# Patient Record
Sex: Female | Born: 1945 | Race: White | Hispanic: No | Marital: Married | Smoking: Former smoker
Health system: Southern US, Community
[De-identification: ages and names within clinical notes are randomized; demographics above are authoritative.]

## PROBLEM LIST (undated history)

## (undated) DIAGNOSIS — J449 Chronic obstructive pulmonary disease, unspecified: Secondary | ICD-10-CM

## (undated) DIAGNOSIS — I341 Nonrheumatic mitral (valve) prolapse: Secondary | ICD-10-CM

## (undated) DIAGNOSIS — R233 Spontaneous ecchymoses: Secondary | ICD-10-CM

## (undated) DIAGNOSIS — R238 Other skin changes: Secondary | ICD-10-CM

## (undated) HISTORY — DX: Spontaneous ecchymoses: R23.3

## (undated) HISTORY — DX: Nonrheumatic mitral (valve) prolapse: I34.1

## (undated) HISTORY — DX: Chronic obstructive pulmonary disease, unspecified: J44.9

## (undated) HISTORY — PX: CHOLECYSTECTOMY: SHX55

## (undated) HISTORY — DX: Other skin changes: R23.8

## (undated) HISTORY — PX: SHOULDER SURGERY: SHX246

---

## 1997-12-20 ENCOUNTER — Inpatient Hospital Stay (HOSPITAL_COMMUNITY): Admission: EM | Admit: 1997-12-20 | Discharge: 1997-12-23 | Payer: Self-pay | Admitting: Cardiology

## 2008-01-04 LAB — PULMONARY FUNCTION TEST

## 2013-04-04 ENCOUNTER — Institutional Professional Consult (permissible substitution): Payer: Self-pay | Admitting: Pulmonary Disease

## 2013-06-16 ENCOUNTER — Ambulatory Visit (INDEPENDENT_AMBULATORY_CARE_PROVIDER_SITE_OTHER): Payer: Medicare PPO

## 2013-06-16 VITALS — BP 125/68 | HR 86 | Resp 18

## 2013-06-16 DIAGNOSIS — M722 Plantar fascial fibromatosis: Secondary | ICD-10-CM

## 2013-06-16 DIAGNOSIS — M79609 Pain in unspecified limb: Secondary | ICD-10-CM

## 2013-06-16 DIAGNOSIS — M7730 Calcaneal spur, unspecified foot: Secondary | ICD-10-CM

## 2013-06-16 MED ORDER — MELOXICAM 15 MG PO TABS: 15.0000 mg | ORAL_TABLET | Freq: Every day | ORAL | Status: DC

## 2013-06-16 MED ORDER — TRIAMCINOLONE ACETONIDE 10 MG/ML IJ SUSP: 10.0000 mg | Freq: Once | INTRAMUSCULAR | Status: AC

## 2013-06-16 NOTE — Progress Notes (Signed)
   Subjective:    Patient ID: Lynn Harris, female    DOB: 1945-05-01, 68 y.o.   MRN: [id number]  HPI my right heel has been hurting and has been going on for about 4 months and hurts on the bottom and has moved to the arch area and is sore and tender and hurts and hurts if I sit too long and used an ice pack and the left is hurting    Review of Systems  Respiratory: Positive for cough and wheezing.   Gastrointestinal:       BLOATING   Endocrine: Positive for heat intolerance.  Musculoskeletal: Positive for gait problem.  Hematological: Bruises/bleeds easily.  All other systems reviewed and are negative.      Objective:   Physical Exam Neurovascular status appears to be intact as follows DP +2/4 bilateral PT plus one over 4 bilateral capillary refill time 3 seconds all digits epicritic and proprioceptive sensations intact and symmetric bilateral there is normal plantar response and DTRs neurologically skin color pigment and hair growth are normal diminished hair growth distally nails criptotic otherwise unremarkable open wounds ulcerations noted no history of injury trauma noted patient indicates that she is wearing some flip-flops and going barefoot for while meds were this exacerbated over the last several months and she's definitely better she does have orthotics that she fit and contour well to the foot which is only worn them intermittently in the last couple of months there's been no significant improvement currently wearing orthotic and athletic type shoe however again has not been wearing orthotics consistently x-rays reveal well-developed inferior calcaneal spur on the right heel previous history for plantar fasciitis on the left which is stable at this time for is having some slight tenderness and mid arch to the right foot has significant pain significant tenderness on the medial calcaneal tubercle from mid arch and medial plantar fascial band on direct palpation and on stretch of  the fascia.       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 at this time patient has significant pain discomfort in her legs take a step first of morning at this time injection tender with Kenalog 20 with Xylocaine plain infiltrated to the inferior plantar heel right heel fascial strapping is applied prescription for [facility] is 4 to the pharmacy as well recommended ice pack to the heel every day maintain her current orthotics or shoes with a good firm stiff sole can use sandals or flip[place]flops at the beach that have some arch support as well recheck in one month if fails to improve or resolve for possible additional injection visual strapping adjustments of orthotics if needed next  Alvan Dame DPM

## 2013-06-16 NOTE — Patient Instructions (Signed)

## 2014-06-09 DIAGNOSIS — H26493 Other secondary cataract, bilateral: Secondary | ICD-10-CM | POA: Diagnosis not present

## 2014-08-10 DIAGNOSIS — N3281 Overactive bladder: Secondary | ICD-10-CM | POA: Diagnosis not present

## 2014-08-10 DIAGNOSIS — F419 Anxiety disorder, unspecified: Secondary | ICD-10-CM | POA: Diagnosis not present

## 2014-08-10 DIAGNOSIS — G2581 Restless legs syndrome: Secondary | ICD-10-CM | POA: Diagnosis not present

## 2014-08-10 DIAGNOSIS — M810 Age-related osteoporosis without current pathological fracture: Secondary | ICD-10-CM | POA: Diagnosis not present

## 2014-08-10 DIAGNOSIS — M79641 Pain in right hand: Secondary | ICD-10-CM | POA: Diagnosis not present

## 2014-08-10 DIAGNOSIS — E780 Pure hypercholesterolemia: Secondary | ICD-10-CM | POA: Diagnosis not present

## 2014-08-10 DIAGNOSIS — F329 Major depressive disorder, single episode, unspecified: Secondary | ICD-10-CM | POA: Diagnosis not present

## 2014-08-10 DIAGNOSIS — I10 Essential (primary) hypertension: Secondary | ICD-10-CM | POA: Diagnosis not present

## 2014-08-10 DIAGNOSIS — J449 Chronic obstructive pulmonary disease, unspecified: Secondary | ICD-10-CM | POA: Diagnosis not present

## 2014-08-24 DIAGNOSIS — G2581 Restless legs syndrome: Secondary | ICD-10-CM | POA: Diagnosis not present

## 2014-08-24 DIAGNOSIS — F419 Anxiety disorder, unspecified: Secondary | ICD-10-CM | POA: Diagnosis not present

## 2014-08-24 DIAGNOSIS — E780 Pure hypercholesterolemia: Secondary | ICD-10-CM | POA: Diagnosis not present

## 2014-08-24 DIAGNOSIS — M79641 Pain in right hand: Secondary | ICD-10-CM | POA: Diagnosis not present

## 2014-08-24 DIAGNOSIS — M25569 Pain in unspecified knee: Secondary | ICD-10-CM | POA: Diagnosis not present

## 2014-08-24 DIAGNOSIS — F329 Major depressive disorder, single episode, unspecified: Secondary | ICD-10-CM | POA: Diagnosis not present

## 2014-08-24 DIAGNOSIS — M810 Age-related osteoporosis without current pathological fracture: Secondary | ICD-10-CM | POA: Diagnosis not present

## 2014-08-24 DIAGNOSIS — I10 Essential (primary) hypertension: Secondary | ICD-10-CM | POA: Diagnosis not present

## 2014-08-24 DIAGNOSIS — J449 Chronic obstructive pulmonary disease, unspecified: Secondary | ICD-10-CM | POA: Diagnosis not present

## 2014-09-11 DIAGNOSIS — R6884 Jaw pain: Secondary | ICD-10-CM | POA: Diagnosis not present

## 2014-09-16 DIAGNOSIS — M810 Age-related osteoporosis without current pathological fracture: Secondary | ICD-10-CM | POA: Diagnosis not present

## 2014-09-16 DIAGNOSIS — J209 Acute bronchitis, unspecified: Secondary | ICD-10-CM | POA: Diagnosis not present

## 2014-09-16 DIAGNOSIS — M25569 Pain in unspecified knee: Secondary | ICD-10-CM | POA: Diagnosis not present

## 2014-09-16 DIAGNOSIS — G2581 Restless legs syndrome: Secondary | ICD-10-CM | POA: Diagnosis not present

## 2014-09-16 DIAGNOSIS — F329 Major depressive disorder, single episode, unspecified: Secondary | ICD-10-CM | POA: Diagnosis not present

## 2014-09-16 DIAGNOSIS — I10 Essential (primary) hypertension: Secondary | ICD-10-CM | POA: Diagnosis not present

## 2014-09-16 DIAGNOSIS — E780 Pure hypercholesterolemia: Secondary | ICD-10-CM | POA: Diagnosis not present

## 2014-09-16 DIAGNOSIS — F419 Anxiety disorder, unspecified: Secondary | ICD-10-CM | POA: Diagnosis not present

## 2014-09-16 DIAGNOSIS — M79641 Pain in right hand: Secondary | ICD-10-CM | POA: Diagnosis not present

## 2014-09-30 DIAGNOSIS — E780 Pure hypercholesterolemia: Secondary | ICD-10-CM | POA: Diagnosis not present

## 2014-09-30 DIAGNOSIS — F329 Major depressive disorder, single episode, unspecified: Secondary | ICD-10-CM | POA: Diagnosis not present

## 2014-09-30 DIAGNOSIS — J209 Acute bronchitis, unspecified: Secondary | ICD-10-CM | POA: Diagnosis not present

## 2014-09-30 DIAGNOSIS — I10 Essential (primary) hypertension: Secondary | ICD-10-CM | POA: Diagnosis not present

## 2014-09-30 DIAGNOSIS — Z9181 History of falling: Secondary | ICD-10-CM | POA: Diagnosis not present

## 2014-09-30 DIAGNOSIS — M810 Age-related osteoporosis without current pathological fracture: Secondary | ICD-10-CM | POA: Diagnosis not present

## 2014-09-30 DIAGNOSIS — N3281 Overactive bladder: Secondary | ICD-10-CM | POA: Diagnosis not present

## 2014-09-30 DIAGNOSIS — G2581 Restless legs syndrome: Secondary | ICD-10-CM | POA: Diagnosis not present

## 2014-09-30 DIAGNOSIS — F419 Anxiety disorder, unspecified: Secondary | ICD-10-CM | POA: Diagnosis not present

## 2014-10-04 DIAGNOSIS — M8589 Other specified disorders of bone density and structure, multiple sites: Secondary | ICD-10-CM | POA: Diagnosis not present

## 2014-10-24 DIAGNOSIS — M810 Age-related osteoporosis without current pathological fracture: Secondary | ICD-10-CM | POA: Diagnosis not present

## 2014-10-24 DIAGNOSIS — F419 Anxiety disorder, unspecified: Secondary | ICD-10-CM | POA: Diagnosis not present

## 2014-10-24 DIAGNOSIS — N3281 Overactive bladder: Secondary | ICD-10-CM | POA: Diagnosis not present

## 2014-10-24 DIAGNOSIS — J209 Acute bronchitis, unspecified: Secondary | ICD-10-CM | POA: Diagnosis not present

## 2014-10-24 DIAGNOSIS — G2581 Restless legs syndrome: Secondary | ICD-10-CM | POA: Diagnosis not present

## 2014-10-24 DIAGNOSIS — E7800 Pure hypercholesterolemia, unspecified: Secondary | ICD-10-CM | POA: Diagnosis not present

## 2014-10-24 DIAGNOSIS — M79641 Pain in right hand: Secondary | ICD-10-CM | POA: Diagnosis not present

## 2014-10-24 DIAGNOSIS — I10 Essential (primary) hypertension: Secondary | ICD-10-CM | POA: Diagnosis not present

## 2014-10-24 DIAGNOSIS — M25569 Pain in unspecified knee: Secondary | ICD-10-CM | POA: Diagnosis not present

## 2014-12-02 DIAGNOSIS — M810 Age-related osteoporosis without current pathological fracture: Secondary | ICD-10-CM | POA: Diagnosis not present

## 2014-12-02 DIAGNOSIS — F419 Anxiety disorder, unspecified: Secondary | ICD-10-CM | POA: Diagnosis not present

## 2014-12-02 DIAGNOSIS — M79641 Pain in right hand: Secondary | ICD-10-CM | POA: Diagnosis not present

## 2014-12-02 DIAGNOSIS — G2581 Restless legs syndrome: Secondary | ICD-10-CM | POA: Diagnosis not present

## 2014-12-02 DIAGNOSIS — F329 Major depressive disorder, single episode, unspecified: Secondary | ICD-10-CM | POA: Diagnosis not present

## 2014-12-02 DIAGNOSIS — M25569 Pain in unspecified knee: Secondary | ICD-10-CM | POA: Diagnosis not present

## 2014-12-02 DIAGNOSIS — E7800 Pure hypercholesterolemia, unspecified: Secondary | ICD-10-CM | POA: Diagnosis not present

## 2014-12-02 DIAGNOSIS — J209 Acute bronchitis, unspecified: Secondary | ICD-10-CM | POA: Diagnosis not present

## 2014-12-02 DIAGNOSIS — I10 Essential (primary) hypertension: Secondary | ICD-10-CM | POA: Diagnosis not present

## 2014-12-28 DIAGNOSIS — E7800 Pure hypercholesterolemia, unspecified: Secondary | ICD-10-CM | POA: Diagnosis not present

## 2014-12-28 DIAGNOSIS — G2581 Restless legs syndrome: Secondary | ICD-10-CM | POA: Diagnosis not present

## 2014-12-28 DIAGNOSIS — J449 Chronic obstructive pulmonary disease, unspecified: Secondary | ICD-10-CM | POA: Diagnosis not present

## 2014-12-28 DIAGNOSIS — M810 Age-related osteoporosis without current pathological fracture: Secondary | ICD-10-CM | POA: Diagnosis not present

## 2014-12-28 DIAGNOSIS — M25569 Pain in unspecified knee: Secondary | ICD-10-CM | POA: Diagnosis not present

## 2014-12-28 DIAGNOSIS — F419 Anxiety disorder, unspecified: Secondary | ICD-10-CM | POA: Diagnosis not present

## 2014-12-28 DIAGNOSIS — N3281 Overactive bladder: Secondary | ICD-10-CM | POA: Diagnosis not present

## 2014-12-28 DIAGNOSIS — F329 Major depressive disorder, single episode, unspecified: Secondary | ICD-10-CM | POA: Diagnosis not present

## 2014-12-28 DIAGNOSIS — J209 Acute bronchitis, unspecified: Secondary | ICD-10-CM | POA: Diagnosis not present

## 2015-01-23 ENCOUNTER — Ambulatory Visit (INDEPENDENT_AMBULATORY_CARE_PROVIDER_SITE_OTHER)
Admission: RE | Admit: 2015-01-23 | Discharge: 2015-01-23 | Disposition: A | Discharge status: Home / Self Care | Payer: Medicare Other | Source: Ambulatory Visit | Attending: Internal Medicine | Admitting: Internal Medicine

## 2015-01-23 ENCOUNTER — Ambulatory Visit (INDEPENDENT_AMBULATORY_CARE_PROVIDER_SITE_OTHER): Payer: Medicare Other | Admitting: Internal Medicine

## 2015-01-23 ENCOUNTER — Encounter: Payer: Self-pay | Admitting: Internal Medicine

## 2015-01-23 VITALS — BP 132/70 | HR 114 | Ht 62.5 in | Wt 137.4 lb

## 2015-01-23 DIAGNOSIS — J449 Chronic obstructive pulmonary disease, unspecified: Secondary | ICD-10-CM

## 2015-01-23 MED ORDER — TIOTROPIUM BROMIDE-OLODATEROL 2.5-2.5 MCG/ACT IN AERS: 2.0000 | INHALATION_SPRAY | Freq: Every day | RESPIRATORY_TRACT | Status: DC

## 2015-01-23 MED ORDER — PANTOPRAZOLE SODIUM 40 MG PO TBEC: 40.0000 mg | DELAYED_RELEASE_TABLET | Freq: Every day | ORAL | Status: DC

## 2015-01-23 MED ORDER — PREDNISONE 10 MG PO TABS: ORAL_TABLET | ORAL | Status: DC

## 2015-01-23 NOTE — Progress Notes (Addendum)
Subjective:    Patient ID: Lynn Harris, female    DOB: Jul 01, 1945,   MRN: [id number]  HPI  [age] yowf quit smoking 2003 with cough / breathing issues with improved breathing the cough never did resolve completely eval by Chodri and some better spiriva and symbicort then much worse x early fall 2016 felt like coming down with bad cough assoc aches and fever and cough persisted so self referred to pulmonary clinic 01/23/2015    01/23/2015 1st [facility] office visit/ Tyion Boylen   Chief Complaint  Patient presents with  . Pulmonary Consult    Self referral for COPD. Pt states that she was dxed with COPD in 2007. She c/o increased cough and SOB for the past 4 months. Cough is prod with clear/white to yellow/green sputum.  She states that she gets SOB with doing housework and "when I get too hot".    was on spiriva dpi prior to the worsening > symbicort added back and couldn't tol > flovent only / not helping though pred always does (very poor hfa see a/p) Cough is 24/7 some worse in ams - onset was same time the breathing worsened  Doe = MMRC2 = can't walk a nl pace on a flat grade s sob    No obvious   day to day or daytime variabilty or assoc chronic cough or cp or chest tightness, subjective wheeze overt sinus or hb symptoms. No unusual exp hx or h/o childhood pna/ asthma or knowledge of premature birth.  Sleeping ok without nocturnal  or early am exacerbation  of respiratory  c/o's or need for noct saba. Also denies any obvious fluctuation of symptoms with weather or environmental changes or other aggravating or alleviating factors except as outlined above   Current Medications, Allergies, Complete Past Medical History, Past Surgical History, Family History, and Social History were reviewed in [facility] electronic medical record.               Review of Systems  Constitutional: Negative for fever, chills and unexpected weight change.  HENT: Positive for congestion and  rhinorrhea. Negative for dental problem, ear pain, nosebleeds, postnasal drip, sinus pressure, sneezing, sore throat, trouble swallowing and voice change.   Eyes: Negative for visual disturbance.  Respiratory: Positive for cough and shortness of breath. Negative for choking.   Cardiovascular: Negative for chest pain and leg swelling.  Gastrointestinal: Negative for vomiting, abdominal pain and diarrhea.  Genitourinary: Negative for difficulty urinating.  Musculoskeletal: Negative for arthralgias.  Skin: Negative for rash.  Neurological: Negative for tremors, syncope and headaches.  Hematological: Does not bruise/bleed easily.       Objective:   Physical Exam  amb anxious wf very evasive with questions re symptoms  Wt Readings from Last 3 Encounters:  01/23/15 137 lb 6.4 oz (62.324 kg)    Vital signs reviewed   HEENT: nl dentition, turbinates, and oropharynx. Nl external ear canals without cough reflex   NECK :  without JVD/Nodes/TM/ nl carotid upstrokes bilaterally   LUNGS: no acc muscle use,  slt barrel contour chest which is clear to A and P bilaterally though bs are distant and Texp mod prolonged    CV:  RRR  no s3 or murmur or increase in P2, no edema   ABD:  soft and nontender with nl inspiratory excursion in the supine position. No bruits or organomegaly, bowel sounds nl  MS:  Nl gait/ ext warm without deformities, calf tenderness, cyanosis or clubbing No obvious  joint restrictions   SKIN: warm and dry without lesions    NEURO:  alert, approp, nl sensorium with  no motor deficits    CXR PA and Lateral:   01/23/2015 :    I personally reviewed images and agree with radiology impression as follows:   COPD. There is no pneumonia, CHF, nor other acute cardiopulmonary abnormality            Assessment & Plan:

## 2015-01-23 NOTE — Patient Instructions (Addendum)
Stop spiriva and flovent   Stiolto 2 puffs each am , if can't take the whole dose just take one puff  Prednisone 10 mg take two with breakfast, then one daily with breakfast until return   Pantoprazole (protonix) 40 mg   Take  30-60 min before first meal of the day and Pepcid (famotidine)  20 mg one @  bedtime until return to office - this is the best way to tell whether stomach acid is contributing to your problem.   Please remember to go to the x-ray department downstairs for your tests - we will call you with the results when they are available.  Please schedule a follow up office visit in 4 weeks, sooner if needed When return bring your medications in 2 separate bags, the ones you take no matter(automatically)  what vs the as needed (only when you feel you need them)  Late add[facility]

## 2015-01-24 NOTE — Progress Notes (Signed)
Quick Note:  Spoke with pt and notified of results per Dr. Wert. Pt verbalized understanding and denied any questions.  ______ 

## 2015-01-24 NOTE — Assessment & Plan Note (Addendum)
DDX of  difficult airways management almost all start with A and  include Adherence, Ace Inhibitors, Acid Reflux, Active Sinus Disease, Alpha 1 Antitripsin deficiency, Anxiety masquerading as Airways dz,  ABPA,  Allergy(esp in young), Aspiration (esp in elderly), Adverse effects of meds,  Active smokers, A bunch of PE's (a small clot burden can't cause this syndrome unless there is already severe underlying pulm or vascular dz with poor reserve) plus two Bs  = Bronchiectasis and Beta blocker use..and one C= CHF   Adherence is always the initial "prime suspect" and is a multilayered concern that requires a "trust but verify" approach in every patient - starting with knowing how to use medications, especially inhalers, correctly, keeping up with refills and understanding the fundamental difference between maintenance and prns vs those medications only taken for a very short course and then stopped and not refilled.  - The proper method of use, as well as anticipated side effects, of a metered-dose inhaler are discussed and demonstrated to the patient. Improved effectiveness after extensive coaching during this visit to a level of approximately 75 % from a baseline of 25 %   ? Acid (or non-acid) GERD > always difficult to exclude as up to 75% of pts in some series report no assoc GI/ Heartburn symptoms> rec max (24h)  acid suppression and diet restrictions/ reviewed and instructions given in writing.  ? Adverse effects of meds, esp dpi suggested by refractory cough > try off    ? Anxiety > usually at the bottom of this list of usual suspects but should be much higher on this pt's based on H and P and note already on psychotropics .   ? Allergy/ asthma > not clear but says pred "only thing that helps " so try prednisone 20 mg daily until better then 10 mg daily   I had an extended discussion with the patient reviewing all relevant studies completed to date and  lasting 35 minutes of a 60 minute initial  eval ov  Each maintenance medication was reviewed in detail including most importantly the difference between maintenance and prns and under what circumstances the prns are to be triggered using an action plan format that is not reflected in the computer generated alphabetically organized AVS.    Please see instructions for details which were reviewed in writing and the patient given a copy highlighting the part that I personally wrote and discussed at today's ov.

## 2015-02-26 ENCOUNTER — Ambulatory Visit (INDEPENDENT_AMBULATORY_CARE_PROVIDER_SITE_OTHER): Payer: Medicare Other | Admitting: Internal Medicine

## 2015-02-26 ENCOUNTER — Encounter: Payer: Self-pay | Admitting: Internal Medicine

## 2015-02-26 VITALS — BP 134/82 | HR 99 | Wt 134.8 lb

## 2015-02-26 DIAGNOSIS — R05 Cough: Secondary | ICD-10-CM

## 2015-02-26 DIAGNOSIS — R058 Other specified cough: Secondary | ICD-10-CM | POA: Insufficient documentation

## 2015-02-26 DIAGNOSIS — J449 Chronic obstructive pulmonary disease, unspecified: Secondary | ICD-10-CM | POA: Diagnosis not present

## 2015-02-26 MED ORDER — ALBUTEROL SULFATE HFA 108 (90 BASE) MCG/ACT IN AERS: INHALATION_SPRAY | RESPIRATORY_TRACT | Status: DC

## 2015-02-26 NOTE — Progress Notes (Signed)
Subjective:    Patient ID: Lynn Harris, female    DOB: 10-21-1945,   MRN: [id number]    Brief patient profile:  64 yowf quit smoking 2003 with cough / breathing issues with improved breathing the cough never did resolve completely eval by Chodri and some better spiriva and symbicort then much worse x early fall 2016 felt like coming down with bad cough assoc aches and fever and cough persisted so self referred to pulmonary clinic 01/23/2015      History of Present Illness  01/23/2015 1st [facility] office visit/ Lynn Harris   Chief Complaint  Patient presents with  . Pulmonary Consult    Self referral for COPD. Pt states that she was dxed with COPD in 2007. She c/o increased cough and SOB for the past 4 months. Cough is prod with clear/white to yellow/green sputum.  She states that she gets SOB with doing housework and "when I get too hot".    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  Please remember to go to the x-ray department downstairs for your tests - we will call you with the results when they are available. Please schedule a follow up office visit in 4 weeks, sooner if needed When return bring your medications in 2 separate bags, the ones you take no matter(automatically)  what vs the as needed (only when you feel you  need them)      02/26/2015  f/u ov/Lynn Harris re:    Copd GOLD III criteria/chronic cough /  maint stiolto / ppi qam ac / no pred did not bring all meds  Chief Complaint  Patient presents with  . Follow-up    Cough is some better. Her breathing has also improved. No new co's today.   still coughing esp early in am but breathing has improved on stiolto/ newly disclosed has been taking fosfamax chronically / not taking pepcid ac hs as rec. Cough remains mostly nonproductive   No obvious day to day or daytime variability or assoc limiting sob/ excess mucus  or cp or chest tightness, subjective wheeze or overt sinus or hb symptoms. No unusual exp hx or h/o childhood pna/ asthma or knowledge of premature birth.  Sleeping ok without nocturnal  or early am exacerbation  of respiratory  c/o's or need for noct saba. Also denies any obvious fluctuation of symptoms with weather or environmental changes or other aggravating or alleviating factors except as outlined above   Current Medications, Allergies, Complete Past Medical History, Past Surgical History, Family History, and Social History were reviewed in [facility] record.  ROS  The following are not active complaints unless bolded sore throat, dysphagia, dental problems, itching, sneezing,  nasal congestion or excess/ purulent secretions, ear ache,   fever, chills, sweats, unintended wt loss, classically pleuritic or exertional cp, hemoptysis,  orthopnea pnd or leg swelling, presyncope, palpitations, abdominal pain, anorexia, nausea, vomiting, diarrhea  or change in bowel or bladder habits, change in stools or urine, dysuria,hematuria,  rash, arthralgias, visual complaints, headache, numbness, weakness or ataxia or problems with walking or coordination,  change in mood/affect or memory.           Objective:   Physical Exam  amb anxious wf very evasive with questions re symptoms "I've got bronchitis again"   Wt Readings from  Last 3 Encounters:  02/26/15 134 lb 12.8 oz (61.145 kg)  01/23/15 137 lb 6.4 oz (62.324 kg)    Vital signs reviewed   HEENT: nl dentition, turbinates, and oropharynx. Nl external ear canals without cough reflex   NECK :  without JVD/Nodes/TM/ nl carotid upstrokes bilaterally   LUNGS: no acc muscle use,  slt barrel contour chest which is clear to A and P bilaterally though bs are distant and Texp mod prolonged    CV:  RRR  no s3 or murmur or increase in P2, no edema   ABD:  soft and nontender with pos hoover's mid insp in the supine position. No bruits or organomegaly, bowel sounds nl  MS:  Nl gait/ ext warm without deformities, calf tenderness, cyanosis or clubbing No obvious joint restrictions   SKIN: warm and dry without lesions    NEURO:  alert, approp, nl sensorium with  no motor deficits    CXR PA and Lateral:   01/23/2015 :    I personally reviewed images and agree with radiology impression as follows:   COPD. There is no pneumonia, CHF, nor other acute cardiopulmonary abnormality            Assessment & Plan:

## 2015-02-26 NOTE — Patient Instructions (Signed)
Try off the fosfamax until cough is 100% gone to your satisfaction   Start Pepcid ac 20 mg at bedtime to see to what extent it helps your am cough over the next 6 weeks   If you are satisfied with your treatment plan,  let your doctor know and he/she can either refill your medications or you can return here when your prescription runs out.     If in any way you are not 100% satisfied,  please tell us.  If 100% better, tell your friends!  Pulmonary follow up is as needed

## 2015-02-26 NOTE — Assessment & Plan Note (Addendum)
01/23/2015    try stiolto 2 each am - Spirometry 02/26/2015  FEV1 0.86 (41%)  Ratio 51   - The proper method of use, as well as anticipated side effects, of a metered-dose inhaler are discussed and demonstrated to the patient. Improved effectiveness after extensive coaching during this visit to a level of approximately 75 % from a baseline of 50 %   No pfts received with Dr Terrilee Croak notes as requested (will re-request) but in meantim she is a likely GOLD III copd severity and doing better on stiolto to date but alse needs rescue rx  - The proper method of use, as well as anticipated side effects, of a metered-dose inhaler are discussed and demonstrated to the patient. Improved effectiveness after extensive coaching during this visit to a level of approximately 75 % from a baseline of 50 %   I had an extended discussion with the patient reviewing all relevant studies completed to date and  lasting 15 to 20 minutes of a 25 minute visit  1) no regular need for pulmonary f/u if not limited from desired activities or having flares on increase need for saba   2) Each maintenance medication was reviewed in detail including most importantly the difference between maintenance and prns and under what circumstances the prns are to be triggered using an action plan format that is not reflected in the computer generated alphabetically organized AVS.    Please see instructions for details which were reviewed in writing and the patient given a copy highlighting the part that I personally wrote and discussed at today's ov.

## 2015-02-26 NOTE — Assessment & Plan Note (Signed)
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   These are the same pts (now being labeled as having "irritable larynx syndrome" by some cough centers) who not infrequently have a history of having failed to tolerate ace inhibitors,  dry powder inhalers(like spiriva)  or biphosphonates(like fosfamax)  or report having atypical reflux symptoms that don't respond to standard doses of PPI , and are easily confused as having aecopd or asthma flares by even experienced allergists/ pulmonologists.   rec max gerd off, continue the respimat version of laba/lama and off fosfamax until completely free of cough then ok to rechallenge.

## 2015-04-25 ENCOUNTER — Telehealth: Payer: Self-pay | Admitting: Internal Medicine

## 2015-04-25 ENCOUNTER — Other Ambulatory Visit: Payer: Self-pay | Admitting: Internal Medicine

## 2015-04-25 DIAGNOSIS — J449 Chronic obstructive pulmonary disease, unspecified: Secondary | ICD-10-CM

## 2015-04-25 MED ORDER — PANTOPRAZOLE SODIUM 40 MG PO TBEC: 40.0000 mg | DELAYED_RELEASE_TABLET | Freq: Every day | ORAL | Status: DC

## 2015-04-25 NOTE — Telephone Encounter (Signed)
Spoke with pt. She needs a refill on pantoprazole. This has been sent in. Nothing further was needed.

## 2016-02-06 ENCOUNTER — Other Ambulatory Visit: Payer: Self-pay | Admitting: Internal Medicine

## 2016-02-06 DIAGNOSIS — J449 Chronic obstructive pulmonary disease, unspecified: Secondary | ICD-10-CM

## 2017-02-25 ENCOUNTER — Other Ambulatory Visit: Payer: Self-pay | Admitting: Internal Medicine

## 2017-02-25 DIAGNOSIS — J449 Chronic obstructive pulmonary disease, unspecified: Secondary | ICD-10-CM

## 2017-03-25 ENCOUNTER — Other Ambulatory Visit: Payer: Self-pay | Admitting: Internal Medicine

## 2017-03-25 DIAGNOSIS — J449 Chronic obstructive pulmonary disease, unspecified: Secondary | ICD-10-CM

## 2019-03-10 DIAGNOSIS — J449 Chronic obstructive pulmonary disease, unspecified: Secondary | ICD-10-CM | POA: Diagnosis not present

## 2019-03-10 DIAGNOSIS — E7800 Pure hypercholesterolemia, unspecified: Secondary | ICD-10-CM | POA: Diagnosis not present

## 2019-03-10 DIAGNOSIS — M25569 Pain in unspecified knee: Secondary | ICD-10-CM | POA: Diagnosis not present

## 2019-03-10 DIAGNOSIS — F419 Anxiety disorder, unspecified: Secondary | ICD-10-CM | POA: Diagnosis not present

## 2019-03-10 DIAGNOSIS — G4733 Obstructive sleep apnea (adult) (pediatric): Secondary | ICD-10-CM | POA: Diagnosis not present

## 2019-03-10 DIAGNOSIS — F3341 Major depressive disorder, recurrent, in partial remission: Secondary | ICD-10-CM | POA: Diagnosis not present

## 2019-03-10 DIAGNOSIS — M79641 Pain in right hand: Secondary | ICD-10-CM | POA: Diagnosis not present

## 2019-03-10 DIAGNOSIS — N3281 Overactive bladder: Secondary | ICD-10-CM | POA: Diagnosis not present

## 2019-03-10 DIAGNOSIS — G2581 Restless legs syndrome: Secondary | ICD-10-CM | POA: Diagnosis not present

## 2019-03-10 DIAGNOSIS — M810 Age-related osteoporosis without current pathological fracture: Secondary | ICD-10-CM | POA: Diagnosis not present

## 2019-03-10 DIAGNOSIS — R030 Elevated blood-pressure reading, without diagnosis of hypertension: Secondary | ICD-10-CM | POA: Diagnosis not present

## 2019-04-06 DIAGNOSIS — L820 Inflamed seborrheic keratosis: Secondary | ICD-10-CM | POA: Diagnosis not present

## 2019-04-06 DIAGNOSIS — D225 Melanocytic nevi of trunk: Secondary | ICD-10-CM | POA: Diagnosis not present

## 2019-04-06 DIAGNOSIS — C44519 Basal cell carcinoma of skin of other part of trunk: Secondary | ICD-10-CM | POA: Diagnosis not present

## 2019-04-06 DIAGNOSIS — L821 Other seborrheic keratosis: Secondary | ICD-10-CM | POA: Diagnosis not present

## 2019-04-07 DIAGNOSIS — F419 Anxiety disorder, unspecified: Secondary | ICD-10-CM | POA: Diagnosis not present

## 2019-04-07 DIAGNOSIS — R030 Elevated blood-pressure reading, without diagnosis of hypertension: Secondary | ICD-10-CM | POA: Diagnosis not present

## 2019-04-07 DIAGNOSIS — Z87891 Personal history of nicotine dependence: Secondary | ICD-10-CM | POA: Diagnosis not present

## 2019-04-07 DIAGNOSIS — R32 Unspecified urinary incontinence: Secondary | ICD-10-CM | POA: Diagnosis not present

## 2019-04-07 DIAGNOSIS — Z809 Family history of malignant neoplasm, unspecified: Secondary | ICD-10-CM | POA: Diagnosis not present

## 2019-04-07 DIAGNOSIS — G2581 Restless legs syndrome: Secondary | ICD-10-CM | POA: Diagnosis not present

## 2019-04-07 DIAGNOSIS — J449 Chronic obstructive pulmonary disease, unspecified: Secondary | ICD-10-CM | POA: Diagnosis not present

## 2019-06-07 DIAGNOSIS — M25569 Pain in unspecified knee: Secondary | ICD-10-CM | POA: Diagnosis not present

## 2019-06-07 DIAGNOSIS — R030 Elevated blood-pressure reading, without diagnosis of hypertension: Secondary | ICD-10-CM | POA: Diagnosis not present

## 2019-06-07 DIAGNOSIS — G2581 Restless legs syndrome: Secondary | ICD-10-CM | POA: Diagnosis not present

## 2019-06-07 DIAGNOSIS — F3341 Major depressive disorder, recurrent, in partial remission: Secondary | ICD-10-CM | POA: Diagnosis not present

## 2019-06-07 DIAGNOSIS — F419 Anxiety disorder, unspecified: Secondary | ICD-10-CM | POA: Diagnosis not present

## 2019-06-07 DIAGNOSIS — M810 Age-related osteoporosis without current pathological fracture: Secondary | ICD-10-CM | POA: Diagnosis not present

## 2019-06-07 DIAGNOSIS — N3281 Overactive bladder: Secondary | ICD-10-CM | POA: Diagnosis not present

## 2019-06-07 DIAGNOSIS — M79641 Pain in right hand: Secondary | ICD-10-CM | POA: Diagnosis not present

## 2019-06-07 DIAGNOSIS — E7800 Pure hypercholesterolemia, unspecified: Secondary | ICD-10-CM | POA: Diagnosis not present

## 2019-07-11 DIAGNOSIS — H0100B Unspecified blepharitis left eye, upper and lower eyelids: Secondary | ICD-10-CM | POA: Diagnosis not present

## 2019-07-11 DIAGNOSIS — H0100A Unspecified blepharitis right eye, upper and lower eyelids: Secondary | ICD-10-CM | POA: Diagnosis not present

## 2019-08-12 DIAGNOSIS — Z87891 Personal history of nicotine dependence: Secondary | ICD-10-CM | POA: Diagnosis not present

## 2019-08-12 DIAGNOSIS — M1711 Unilateral primary osteoarthritis, right knee: Secondary | ICD-10-CM | POA: Diagnosis not present

## 2019-08-12 DIAGNOSIS — Z79899 Other long term (current) drug therapy: Secondary | ICD-10-CM | POA: Diagnosis not present

## 2019-08-12 DIAGNOSIS — M79632 Pain in left forearm: Secondary | ICD-10-CM | POA: Diagnosis not present

## 2019-08-12 DIAGNOSIS — S59912A Unspecified injury of left forearm, initial encounter: Secondary | ICD-10-CM | POA: Diagnosis not present

## 2019-08-12 DIAGNOSIS — M25561 Pain in right knee: Secondary | ICD-10-CM | POA: Diagnosis not present

## 2019-09-07 DIAGNOSIS — N3281 Overactive bladder: Secondary | ICD-10-CM | POA: Diagnosis not present

## 2019-09-07 DIAGNOSIS — F419 Anxiety disorder, unspecified: Secondary | ICD-10-CM | POA: Diagnosis not present

## 2019-09-07 DIAGNOSIS — G2581 Restless legs syndrome: Secondary | ICD-10-CM | POA: Diagnosis not present

## 2019-09-07 DIAGNOSIS — Z9181 History of falling: Secondary | ICD-10-CM | POA: Diagnosis not present

## 2019-09-07 DIAGNOSIS — F3341 Major depressive disorder, recurrent, in partial remission: Secondary | ICD-10-CM | POA: Diagnosis not present

## 2019-09-07 DIAGNOSIS — M79641 Pain in right hand: Secondary | ICD-10-CM | POA: Diagnosis not present

## 2019-09-07 DIAGNOSIS — J449 Chronic obstructive pulmonary disease, unspecified: Secondary | ICD-10-CM | POA: Diagnosis not present

## 2019-09-07 DIAGNOSIS — M810 Age-related osteoporosis without current pathological fracture: Secondary | ICD-10-CM | POA: Diagnosis not present

## 2019-09-07 DIAGNOSIS — E7800 Pure hypercholesterolemia, unspecified: Secondary | ICD-10-CM | POA: Diagnosis not present

## 2019-09-07 DIAGNOSIS — R030 Elevated blood-pressure reading, without diagnosis of hypertension: Secondary | ICD-10-CM | POA: Diagnosis not present

## 2019-09-07 DIAGNOSIS — M25569 Pain in unspecified knee: Secondary | ICD-10-CM | POA: Diagnosis not present

## 2019-09-15 DIAGNOSIS — J209 Acute bronchitis, unspecified: Secondary | ICD-10-CM | POA: Diagnosis not present

## 2019-09-15 DIAGNOSIS — Z20822 Contact with and (suspected) exposure to covid-19: Secondary | ICD-10-CM | POA: Diagnosis not present

## 2019-11-04 DIAGNOSIS — Z23 Encounter for immunization: Secondary | ICD-10-CM | POA: Diagnosis not present

## 2019-12-14 DIAGNOSIS — J449 Chronic obstructive pulmonary disease, unspecified: Secondary | ICD-10-CM | POA: Diagnosis not present

## 2019-12-14 DIAGNOSIS — M79641 Pain in right hand: Secondary | ICD-10-CM | POA: Diagnosis not present

## 2019-12-14 DIAGNOSIS — G2581 Restless legs syndrome: Secondary | ICD-10-CM | POA: Diagnosis not present

## 2019-12-14 DIAGNOSIS — M810 Age-related osteoporosis without current pathological fracture: Secondary | ICD-10-CM | POA: Diagnosis not present

## 2019-12-14 DIAGNOSIS — N3281 Overactive bladder: Secondary | ICD-10-CM | POA: Diagnosis not present

## 2019-12-14 DIAGNOSIS — E7800 Pure hypercholesterolemia, unspecified: Secondary | ICD-10-CM | POA: Diagnosis not present

## 2019-12-14 DIAGNOSIS — M25569 Pain in unspecified knee: Secondary | ICD-10-CM | POA: Diagnosis not present

## 2019-12-14 DIAGNOSIS — F3341 Major depressive disorder, recurrent, in partial remission: Secondary | ICD-10-CM | POA: Diagnosis not present

## 2019-12-14 DIAGNOSIS — F419 Anxiety disorder, unspecified: Secondary | ICD-10-CM | POA: Diagnosis not present

## 2019-12-14 DIAGNOSIS — G4733 Obstructive sleep apnea (adult) (pediatric): Secondary | ICD-10-CM | POA: Diagnosis not present

## 2019-12-14 DIAGNOSIS — R030 Elevated blood-pressure reading, without diagnosis of hypertension: Secondary | ICD-10-CM | POA: Diagnosis not present

## 2019-12-28 DIAGNOSIS — F3341 Major depressive disorder, recurrent, in partial remission: Secondary | ICD-10-CM | POA: Diagnosis not present

## 2019-12-28 DIAGNOSIS — M79641 Pain in right hand: Secondary | ICD-10-CM | POA: Diagnosis not present

## 2019-12-28 DIAGNOSIS — F419 Anxiety disorder, unspecified: Secondary | ICD-10-CM | POA: Diagnosis not present

## 2019-12-28 DIAGNOSIS — G2581 Restless legs syndrome: Secondary | ICD-10-CM | POA: Diagnosis not present

## 2019-12-28 DIAGNOSIS — M25569 Pain in unspecified knee: Secondary | ICD-10-CM | POA: Diagnosis not present

## 2019-12-28 DIAGNOSIS — Z139 Encounter for screening, unspecified: Secondary | ICD-10-CM | POA: Diagnosis not present

## 2019-12-28 DIAGNOSIS — N3281 Overactive bladder: Secondary | ICD-10-CM | POA: Diagnosis not present

## 2019-12-28 DIAGNOSIS — J449 Chronic obstructive pulmonary disease, unspecified: Secondary | ICD-10-CM | POA: Diagnosis not present

## 2019-12-28 DIAGNOSIS — G4733 Obstructive sleep apnea (adult) (pediatric): Secondary | ICD-10-CM | POA: Diagnosis not present

## 2020-01-25 DIAGNOSIS — N3281 Overactive bladder: Secondary | ICD-10-CM | POA: Diagnosis not present

## 2020-01-25 DIAGNOSIS — G2581 Restless legs syndrome: Secondary | ICD-10-CM | POA: Diagnosis not present

## 2020-01-25 DIAGNOSIS — G4733 Obstructive sleep apnea (adult) (pediatric): Secondary | ICD-10-CM | POA: Diagnosis not present

## 2020-01-25 DIAGNOSIS — F419 Anxiety disorder, unspecified: Secondary | ICD-10-CM | POA: Diagnosis not present

## 2020-01-25 DIAGNOSIS — M25569 Pain in unspecified knee: Secondary | ICD-10-CM | POA: Diagnosis not present

## 2020-01-25 DIAGNOSIS — J449 Chronic obstructive pulmonary disease, unspecified: Secondary | ICD-10-CM | POA: Diagnosis not present

## 2020-01-25 DIAGNOSIS — Z139 Encounter for screening, unspecified: Secondary | ICD-10-CM | POA: Diagnosis not present

## 2020-01-25 DIAGNOSIS — F3341 Major depressive disorder, recurrent, in partial remission: Secondary | ICD-10-CM | POA: Diagnosis not present

## 2020-01-25 DIAGNOSIS — M79641 Pain in right hand: Secondary | ICD-10-CM | POA: Diagnosis not present

## 2020-02-22 DIAGNOSIS — M25569 Pain in unspecified knee: Secondary | ICD-10-CM | POA: Diagnosis not present

## 2020-02-22 DIAGNOSIS — M79641 Pain in right hand: Secondary | ICD-10-CM | POA: Diagnosis not present

## 2020-02-22 DIAGNOSIS — G2581 Restless legs syndrome: Secondary | ICD-10-CM | POA: Diagnosis not present

## 2020-02-22 DIAGNOSIS — N3281 Overactive bladder: Secondary | ICD-10-CM | POA: Diagnosis not present

## 2020-02-22 DIAGNOSIS — J449 Chronic obstructive pulmonary disease, unspecified: Secondary | ICD-10-CM | POA: Diagnosis not present

## 2020-02-22 DIAGNOSIS — F419 Anxiety disorder, unspecified: Secondary | ICD-10-CM | POA: Diagnosis not present

## 2020-02-22 DIAGNOSIS — Z139 Encounter for screening, unspecified: Secondary | ICD-10-CM | POA: Diagnosis not present

## 2020-02-22 DIAGNOSIS — F3341 Major depressive disorder, recurrent, in partial remission: Secondary | ICD-10-CM | POA: Diagnosis not present

## 2020-02-22 DIAGNOSIS — G4733 Obstructive sleep apnea (adult) (pediatric): Secondary | ICD-10-CM | POA: Diagnosis not present

## 2020-03-21 DIAGNOSIS — G4733 Obstructive sleep apnea (adult) (pediatric): Secondary | ICD-10-CM | POA: Diagnosis not present

## 2020-03-21 DIAGNOSIS — M79641 Pain in right hand: Secondary | ICD-10-CM | POA: Diagnosis not present

## 2020-03-21 DIAGNOSIS — F419 Anxiety disorder, unspecified: Secondary | ICD-10-CM | POA: Diagnosis not present

## 2020-03-21 DIAGNOSIS — F3341 Major depressive disorder, recurrent, in partial remission: Secondary | ICD-10-CM | POA: Diagnosis not present

## 2020-03-21 DIAGNOSIS — M810 Age-related osteoporosis without current pathological fracture: Secondary | ICD-10-CM | POA: Diagnosis not present

## 2020-03-21 DIAGNOSIS — G2581 Restless legs syndrome: Secondary | ICD-10-CM | POA: Diagnosis not present

## 2020-03-21 DIAGNOSIS — N3281 Overactive bladder: Secondary | ICD-10-CM | POA: Diagnosis not present

## 2020-03-21 DIAGNOSIS — M25569 Pain in unspecified knee: Secondary | ICD-10-CM | POA: Diagnosis not present

## 2020-03-21 DIAGNOSIS — E7800 Pure hypercholesterolemia, unspecified: Secondary | ICD-10-CM | POA: Diagnosis not present

## 2020-03-21 DIAGNOSIS — R030 Elevated blood-pressure reading, without diagnosis of hypertension: Secondary | ICD-10-CM | POA: Diagnosis not present

## 2020-03-21 DIAGNOSIS — J449 Chronic obstructive pulmonary disease, unspecified: Secondary | ICD-10-CM | POA: Diagnosis not present

## 2020-03-26 DIAGNOSIS — F3341 Major depressive disorder, recurrent, in partial remission: Secondary | ICD-10-CM | POA: Diagnosis not present

## 2020-03-26 DIAGNOSIS — J449 Chronic obstructive pulmonary disease, unspecified: Secondary | ICD-10-CM | POA: Diagnosis not present

## 2020-03-26 DIAGNOSIS — R6889 Other general symptoms and signs: Secondary | ICD-10-CM | POA: Diagnosis not present

## 2020-03-26 DIAGNOSIS — Z20822 Contact with and (suspected) exposure to covid-19: Secondary | ICD-10-CM | POA: Diagnosis not present

## 2020-03-26 DIAGNOSIS — Z6822 Body mass index (BMI) 22.0-22.9, adult: Secondary | ICD-10-CM | POA: Diagnosis not present

## 2020-03-28 DIAGNOSIS — J449 Chronic obstructive pulmonary disease, unspecified: Secondary | ICD-10-CM | POA: Diagnosis not present

## 2020-03-28 DIAGNOSIS — J069 Acute upper respiratory infection, unspecified: Secondary | ICD-10-CM | POA: Diagnosis not present

## 2020-03-28 DIAGNOSIS — F3341 Major depressive disorder, recurrent, in partial remission: Secondary | ICD-10-CM | POA: Diagnosis not present

## 2020-03-28 DIAGNOSIS — Z6822 Body mass index (BMI) 22.0-22.9, adult: Secondary | ICD-10-CM | POA: Diagnosis not present

## 2020-04-03 DIAGNOSIS — E785 Hyperlipidemia, unspecified: Secondary | ICD-10-CM | POA: Diagnosis not present

## 2020-04-03 DIAGNOSIS — Z9181 History of falling: Secondary | ICD-10-CM | POA: Diagnosis not present

## 2020-04-03 DIAGNOSIS — Z1331 Encounter for screening for depression: Secondary | ICD-10-CM | POA: Diagnosis not present

## 2020-04-03 DIAGNOSIS — Z0000 Encounter for general adult medical examination without abnormal findings: Secondary | ICD-10-CM | POA: Diagnosis not present

## 2020-04-18 DIAGNOSIS — F3341 Major depressive disorder, recurrent, in partial remission: Secondary | ICD-10-CM | POA: Diagnosis not present

## 2020-04-18 DIAGNOSIS — Z6822 Body mass index (BMI) 22.0-22.9, adult: Secondary | ICD-10-CM | POA: Diagnosis not present

## 2020-04-18 DIAGNOSIS — J069 Acute upper respiratory infection, unspecified: Secondary | ICD-10-CM | POA: Diagnosis not present

## 2020-04-18 DIAGNOSIS — J449 Chronic obstructive pulmonary disease, unspecified: Secondary | ICD-10-CM | POA: Diagnosis not present

## 2020-04-23 DIAGNOSIS — F3341 Major depressive disorder, recurrent, in partial remission: Secondary | ICD-10-CM | POA: Diagnosis not present

## 2020-04-23 DIAGNOSIS — J449 Chronic obstructive pulmonary disease, unspecified: Secondary | ICD-10-CM | POA: Diagnosis not present

## 2020-04-23 DIAGNOSIS — J069 Acute upper respiratory infection, unspecified: Secondary | ICD-10-CM | POA: Diagnosis not present

## 2020-04-23 DIAGNOSIS — Z6823 Body mass index (BMI) 23.0-23.9, adult: Secondary | ICD-10-CM | POA: Diagnosis not present

## 2020-05-07 DIAGNOSIS — J449 Chronic obstructive pulmonary disease, unspecified: Secondary | ICD-10-CM | POA: Diagnosis not present

## 2020-05-07 DIAGNOSIS — H35373 Puckering of macula, bilateral: Secondary | ICD-10-CM | POA: Diagnosis not present

## 2020-05-07 DIAGNOSIS — G2581 Restless legs syndrome: Secondary | ICD-10-CM | POA: Diagnosis not present

## 2020-05-07 DIAGNOSIS — E7800 Pure hypercholesterolemia, unspecified: Secondary | ICD-10-CM | POA: Diagnosis not present

## 2020-05-07 DIAGNOSIS — R030 Elevated blood-pressure reading, without diagnosis of hypertension: Secondary | ICD-10-CM | POA: Diagnosis not present

## 2020-05-07 DIAGNOSIS — M25569 Pain in unspecified knee: Secondary | ICD-10-CM | POA: Diagnosis not present

## 2020-05-07 DIAGNOSIS — F3341 Major depressive disorder, recurrent, in partial remission: Secondary | ICD-10-CM | POA: Diagnosis not present

## 2020-05-07 DIAGNOSIS — N3281 Overactive bladder: Secondary | ICD-10-CM | POA: Diagnosis not present

## 2020-05-07 DIAGNOSIS — M79641 Pain in right hand: Secondary | ICD-10-CM | POA: Diagnosis not present

## 2020-05-07 DIAGNOSIS — F419 Anxiety disorder, unspecified: Secondary | ICD-10-CM | POA: Diagnosis not present

## 2020-05-23 ENCOUNTER — Ambulatory Visit (INDEPENDENT_AMBULATORY_CARE_PROVIDER_SITE_OTHER): Payer: Medicare PPO

## 2020-05-23 ENCOUNTER — Ambulatory Visit: Payer: Medicare PPO | Admitting: Internal Medicine

## 2020-05-23 ENCOUNTER — Other Ambulatory Visit: Payer: Self-pay

## 2020-05-23 ENCOUNTER — Encounter: Payer: Self-pay | Admitting: Internal Medicine

## 2020-05-23 VITALS — BP 112/68 | HR 67 | Temp 97.8°F | Ht 62.0 in | Wt 127.0 lb

## 2020-05-23 DIAGNOSIS — J449 Chronic obstructive pulmonary disease, unspecified: Secondary | ICD-10-CM

## 2020-05-23 DIAGNOSIS — R058 Other specified cough: Secondary | ICD-10-CM | POA: Diagnosis not present

## 2020-05-23 DIAGNOSIS — R059 Cough, unspecified: Secondary | ICD-10-CM | POA: Diagnosis not present

## 2020-05-23 LAB — CBC WITH DIFFERENTIAL/PLATELET
Basophils Absolute: 0.1 10*3/uL (ref 0.0–0.1)
Basophils Relative: 0.7 % (ref 0.0–3.0)
Eosinophils Absolute: 0.2 10*3/uL (ref 0.0–0.7)
Eosinophils Relative: 2.7 % (ref 0.0–5.0)
HCT: 38.4 % (ref 36.0–46.0)
Hemoglobin: 13.2 g/dL (ref 12.0–15.0)
Lymphocytes Relative: 19.7 % (ref 12.0–46.0)
Lymphs Abs: 1.4 10*3/uL (ref 0.7–4.0)
MCHC: 34.3 g/dL (ref 30.0–36.0)
MCV: 87.7 fl (ref 78.0–100.0)
Monocytes Absolute: 0.5 10*3/uL (ref 0.1–1.0)
Monocytes Relative: 6.7 % (ref 3.0–12.0)
Neutro Abs: 5 10*3/uL (ref 1.4–7.7)
Neutrophils Relative %: 70.2 % (ref 43.0–77.0)
Platelets: 214 10*3/uL (ref 150.0–400.0)
RBC: 4.38 Mil/uL (ref 3.87–5.11)
RDW: 14.1 % (ref 11.5–15.5)
WBC: 7.2 10*3/uL (ref 4.0–10.5)

## 2020-05-23 MED ORDER — ALBUTEROL SULFATE HFA 108 (90 BASE) MCG/ACT IN AERS: INHALATION_SPRAY | RESPIRATORY_TRACT | 11 refills | Status: AC

## 2020-05-23 MED ORDER — PREDNISONE 10 MG PO TABS: ORAL_TABLET | ORAL | 0 refills | Status: DC

## 2020-05-23 MED ORDER — FAMOTIDINE 20 MG PO TABS: ORAL_TABLET | ORAL | 11 refills | Status: AC

## 2020-05-23 MED ORDER — STIOLTO RESPIMAT 2.5-2.5 MCG/ACT IN AERS: 2.0000 | INHALATION_SPRAY | Freq: Every day | RESPIRATORY_TRACT | 0 refills | Status: DC

## 2020-05-23 MED ORDER — PANTOPRAZOLE SODIUM 40 MG PO TBEC: 40.0000 mg | DELAYED_RELEASE_TABLET | Freq: Every day | ORAL | 2 refills | Status: DC

## 2020-05-23 NOTE — Assessment & Plan Note (Signed)
Max rx for gerd and off fosamax 02/26/2015 >>> ? Response - 05/23/2020 retry off fosamax and max gerd rx   Upper airway cough syndrome (previously labeled PNDS),  is so named because it's frequently impossible to sort out how much is  CR/sinusitis with freq throat clearing (which can be related to primary GERD)   vs  causing  secondary (" extra esophageal")  GERD from wide swings in gastric pressure that occur with throat clearing, often  promoting self use of mint and menthol lozenges that reduce the lower esophageal sphincter tone and exacerbate the problem further in a cyclical fashion.   These are the same pts (now being labeled as having "irritable larynx syndrome" by some cough centers) who not infrequently have a history of having failed to tolerate ace inhibitors,  dry powder inhalers or biphosphonates or report having atypical/extraesophageal reflux symptoms that don't respond to standard doses of PPI  and are easily confused as having aecopd or asthma flares by even experienced allergists/ pulmonologists (myself included).   See above plan, f/u in 4 weeks

## 2020-05-23 NOTE — Patient Instructions (Addendum)
Stop powdered spiriva  Prednisone 10 mg take  4 each am x 2 days,   2 each am x 2 days,  1 each am x 2 days and stop   Plan A = Automatic = Always=    Stiolto one puff each am   Work on inhaler technique:  relax and gently blow all the way out then take a nice smooth deep breath back in, triggering the inhaler at same time you start breathing in.  Hold for up to 5 seconds if you can.  . Rinse and gargle with water when done     Plan B = Backup (to supplement plan A, not to replace it) Only use your albuterol inhaler as a rescue medication to be used if you can't catch your breath by resting or doing a relaxed purse lip breathing pattern.  - The less you use it, the better it will work when you need it. - Ok to use the inhaler up to 2 puffs  every 4 hours if you must but call for appointment if use goes up over your usual need - Don't leave home without it !!  (think of it like the spare tire for your car)     Stop fosamax   Pantoprazole (protonix) 40 mg   Take  30-60 min before first meal of the day and Pepcid (famotidine)  20 mg after supper until return to office - this is the best way to tell whether stomach acid is contributing to your problem.    GERD (REFLUX)  is an extremely common cause of respiratory symptoms just like yours , many times with no obvious heartburn at all.    It can be treated with medication, but also with lifestyle changes including elevation of the head of your bed (ideally with 6 -8inch blocks under the headboard of your bed),  Smoking cessation, avoidance of late meals, excessive alcohol, and avoid fatty foods, chocolate, peppermint, colas, red wine, and acidic juices such as orange juice.  NO MINT OR MENTHOL PRODUCTS SO NO COUGH DROPS  USE SUGARLESS CANDY INSTEAD (Jolley ranchers or Stover's or Life Savers) or even ice chips will also do - the key is to swallow to prevent all throat clearing. NO OIL BASED VITAMINS - use powdered substitutes.  Avoid fish oil when  coughing.    Please remember to go to the lab and x-ray department  for your tests - we will call you with the results when they are available.       Please schedule a follow up office visit in 4 weeks, sooner if needed  with all medications /inhalers/ solutions in hand so we can verify exactly what you are taking. This includes all medications from all doctors and over the counters

## 2020-05-23 NOTE — Progress Notes (Signed)
Subjective:    Patient ID: Lynn Harris, female    DOB: [birth year],   MRN: [id number]    Brief patient profile:  71  yowf quit smoking 2003 with cough / breathing issues with improved breathing the cough never did resolve completely eval by Chodri and some better spiriva and symbicort then much worse x early fall 2016 felt like coming down with bad cough assoc aches and fever and cough persisted so self referred to pulmonary clinic 01/23/2015      History of Present Illness  01/23/2015 1st [facility] office visit/ Lynn Harris   Chief Complaint  Patient presents with  . Pulmonary Consult    Self referral for COPD. Pt states that she was dxed with COPD in 2007. She c/o increased cough and SOB for the past 4 months. Cough is prod with clear/white to yellow/green sputum.  She states that she gets SOB with doing housework and "when I get too hot".    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  Please remember to go to the x-ray department downstairs for your tests - we will call you with the results when they are available. Please schedule a follow up office visit in 4 weeks, sooner if needed When return bring your medications in 2 separate bags, the ones you take no matter(automatically)  what vs the as needed (only when you feel  you need them)      02/26/2015  f/u ov/Lynn Harris re:    Copd GOLD III criteria/chronic cough /  maint stiolto / ppi qam ac / no pred did not bring all meds  Chief Complaint  Patient presents with  . Follow-up    Cough is some better. Her breathing has also improved. No new co's today.   still coughing esp early in am but breathing has improved on stiolto/ newly disclosed has been taking fosfamax chronically / not taking pepcid ac hs as rec. Cough remains mostly nonproductive  rec Try off the fosamax until cough is 100% gone to your satisfaction  Start Pepcid ac 20 mg at bedtime to see to what extent it helps your am cough over the next 6 weeks   05/23/2020  Re-establish  ov/Lynn Harris re:  GOLD III COPD  Chief Complaint  Patient presents with  . Follow-up  . Consult    SOB with exertion, Dry and productive cough, wheezing. Last used albuterol a week ago. 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    Also denies any obvious fluctuation of symptoms with weather or environmental changes or other aggravating or alleviating factors except as outlined above   No unusual exposure hx or h/o childhood pna/ asthma or knowledge of premature birth.  Current Allergies, Complete Past Medical History, Past Surgical History, Family History, and Social History were reviewed in [employer] record.  ROS  The following are not active complaints unless bolded Hoarseness, sore throat, dysphagia, dental problems, itching, sneezing,  nasal congestion or  discharge of excess mucus or purulent secretions, ear ache,   fever, chills, sweats, unintended wt loss or wt gain, classically pleuritic or exertional cp,  orthopnea pnd or arm/hand swelling  or leg swelling, presyncope, palpitations, abdominal pain, anorexia, nausea, vomiting, diarrhea  or change in bowel habits or change in bladder habits, change in stools or change in urine, dysuria, hematuria,  rash, arthralgias, visual complaints, headache, numbness, weakness or ataxia or problems with walking or coordination,  change in mood or  memory.        Current Meds  Medication Sig  . alendronate (FOSAMAX) 70 MG tablet Take 70 mg by mouth once a week. Take with a full glass of water on an empty stomach.  . escitalopram (LEXAPRO) 10 MG tablet Take 10 mg by mouth daily.  . Multiple Vitamin (MULTIVITAMIN) tablet Take 1 tablet by mouth daily. VIT B AND VIT D/PATIENTNOT SURE OF THE MG/LC  . STIOLTO RESPIMAT 2.5-2.5 MCG/ACT AERS INHALE TWO PUFFS ONCE DAILY  . tiotropium (SPIRIVA) 18 MCG inhalation capsule Place 18 mcg into inhaler and inhale daily.  . [DISCONTINUED] Ascorbic Acid (VITAMIN C) 100 MG tablet Take 100 mg by mouth daily.   Current Facility-Administered Medications for the 05/23/20 encounter (Office Visit) with Nyoka Cowden, MD  Medication  . triamcinolone acetonide (KENALOG) 10 MG/ML injection 10 mg               Objective:   Physical Exam Vital signs reviewed  05/23/2020  - Note at rest 02 sats  98% on RA   General appearance:    amb wf nad    05/23/2020        127   02/26/15 134 lb 12.8 oz (61.145 kg)  01/23/15 137 lb 6.4 oz (62.324 kg)     HEENT : pt wearing mask not removed for exam due to covid - 19 concerns.   NECK :  without JVD/Nodes/TM/ nl carotid upstrokes bilaterally   LUNGS: no acc muscle use,  Min barrel  contour chest wall with bilateral  slightly decreased bs s audible wheeze and  without cough on insp or exp maneuvers and min  Hyperresonant  to  percussion  bilaterally     CV:  RRR  no s3 or murmur or increase in P2, and no edema   ABD:  soft and nontender with pos end  insp Hoover's  in the supine position. No bruits or organomegaly appreciated, bowel sounds nl  MS:   Nl gait/  ext warm without deformities, calf tenderness, cyanosis or clubbing No obvious joint restrictions   SKIN: warm and dry without lesions    NEURO:  alert, approp, nl sensorium with  no motor or cerebellar deficits apparent.       Labs ordered 05/23/2020  :  allergy profile   alpha one AT phenotype    CXR PA and Lateral:   05/23/2020 :    I personally reviewed images and agree with radiology impression as follows:    severe copd, increase markings Lingula chronic       Assessment & Plan:

## 2020-05-23 NOTE — Assessment & Plan Note (Addendum)
Quit smoking 2003  -  PFTs 01/04/08   FEV1  1.29 (58%) ratio 60 p 18% response to saba - 01/23/2015    try stiolto 2 each am - Spirometry 02/26/2015  FEV1 0.86 (41%)  ratio 51 -  Allergy profile 05/23/2020 >  Eos 0.2 /  IgE   -   05/23/2020   Walked RA  3laps @ approx 265ft each @ moderate pace  stopped due to end of study with mild  sob and sats still 97%  - 05/23/2020  After extensive coaching inhaler device,  effectiveness =    75% > restart stiolto but just one puff daily and d/c dpi spiriva as cough is one of the main problem  DDX of  difficult airways management almost all start with A and  include Adherence, Ace Inhibitors, Acid Reflux, Active Sinus Disease, Alpha 1 Antitripsin deficiency, Anxiety masquerading as Airways dz,  ABPA,  Allergy(esp in young), Aspiration (esp in elderly), Adverse effects of meds,  Active smoking or vaping, A bunch of PE's (a small clot burden can't cause this syndrome unless there is already severe underlying pulm or vascular dz with poor reserve) plus two Bs  = Bronchiectasis and Beta blocker use..and one C= CHF   Adherence is always the initial "prime suspect" and is a multilayered concern that requires a "trust but verify" approach in every patient - starting with knowing how to use medications, especially inhalers, correctly, keeping up with refills and understanding the fundamental difference between maintenance and prns vs those medications only taken for a very short course and then stopped and not refilled.  - see SMI coaching - return in 4 weeks with all meds in hand using a trust but verify approach to confirm accurate Medication  Reconciliation The principal here is that until we are certain that the  patients are doing what we've asked, it makes no sense to ask them to do more.   ? Acid (or non-acid) GERD > always difficult to exclude as up to 75% of pts in some series report no assoc GI/ Heartburn symptoms and she has overt "indigestion" on chronic fosamax >  rec max (24h)  acid suppression and diet restrictions/ reviewed and instructions given in writing/ d/c fosamax for now ? Use prolia or reclast instead if biphosphonate really still needed   ? Adverse effects of meds, esp DPI in pt with prominent cough features >  D/c dpi   ? Allergies/ ABPA  >  Check profile, Prednisone 10 mg take  4 each am x 2 days,   2 each am x 2 days,  1 each am x 2 days and stop   ? Alpha one AT def > order at next ov ([facility] now)   ? Bronchiectasis > ?  need HRCT if not improving   F/u  4 weeks  Each maintenance medication was reviewed in detail including emphasizing most importantly the difference between maintenance and prns and under what circumstances the prns are to be triggered using an action plan format where appropriate.  Total time for H and P, chart review, counseling, reviewing [facility]  device(s) , directly observing portions of ambulatory 02 saturation study/ and generating customized AVS unique to this office visit / same day charting = 55 min

## 2020-05-24 ENCOUNTER — Encounter: Payer: Self-pay | Admitting: *Deleted

## 2020-05-24 LAB — IGE: IgE (Immunoglobulin E), Serum: 8 kU/L (ref <=114)

## 2020-05-25 DIAGNOSIS — M85831 Other specified disorders of bone density and structure, right forearm: Secondary | ICD-10-CM | POA: Diagnosis not present

## 2020-05-25 DIAGNOSIS — N959 Unspecified menopausal and perimenopausal disorder: Secondary | ICD-10-CM | POA: Diagnosis not present

## 2020-05-25 DIAGNOSIS — Z1231 Encounter for screening mammogram for malignant neoplasm of breast: Secondary | ICD-10-CM | POA: Diagnosis not present

## 2020-05-25 DIAGNOSIS — M85852 Other specified disorders of bone density and structure, left thigh: Secondary | ICD-10-CM | POA: Diagnosis not present

## 2020-05-28 ENCOUNTER — Encounter: Payer: Self-pay | Admitting: *Deleted

## 2020-06-18 ENCOUNTER — Telehealth: Payer: Self-pay | Admitting: Internal Medicine

## 2020-06-18 DIAGNOSIS — N3281 Overactive bladder: Secondary | ICD-10-CM | POA: Diagnosis not present

## 2020-06-18 DIAGNOSIS — J069 Acute upper respiratory infection, unspecified: Secondary | ICD-10-CM | POA: Diagnosis not present

## 2020-06-18 DIAGNOSIS — J449 Chronic obstructive pulmonary disease, unspecified: Secondary | ICD-10-CM | POA: Diagnosis not present

## 2020-06-18 DIAGNOSIS — Z20822 Contact with and (suspected) exposure to covid-19: Secondary | ICD-10-CM | POA: Diagnosis not present

## 2020-06-18 DIAGNOSIS — R030 Elevated blood-pressure reading, without diagnosis of hypertension: Secondary | ICD-10-CM | POA: Diagnosis not present

## 2020-06-18 DIAGNOSIS — M79641 Pain in right hand: Secondary | ICD-10-CM | POA: Diagnosis not present

## 2020-06-18 DIAGNOSIS — F419 Anxiety disorder, unspecified: Secondary | ICD-10-CM | POA: Diagnosis not present

## 2020-06-18 DIAGNOSIS — F3341 Major depressive disorder, recurrent, in partial remission: Secondary | ICD-10-CM | POA: Diagnosis not present

## 2020-06-18 DIAGNOSIS — G2581 Restless legs syndrome: Secondary | ICD-10-CM | POA: Diagnosis not present

## 2020-06-18 NOTE — Telephone Encounter (Signed)
Spoke with pt and advised that 11 refills of Albuterol were sent to the [facility] on [address] on 05/23/20. I advised pt that she can call the pharmacy and ask them to fill what is on file. Pt verbalized understanding. Nothing further needed.

## 2020-06-20 ENCOUNTER — Ambulatory Visit: Payer: Medicare PPO | Admitting: Internal Medicine

## 2020-06-21 DIAGNOSIS — E7800 Pure hypercholesterolemia, unspecified: Secondary | ICD-10-CM | POA: Diagnosis not present

## 2020-06-21 DIAGNOSIS — B9689 Other specified bacterial agents as the cause of diseases classified elsewhere: Secondary | ICD-10-CM | POA: Diagnosis not present

## 2020-06-21 DIAGNOSIS — G2581 Restless legs syndrome: Secondary | ICD-10-CM | POA: Diagnosis not present

## 2020-06-21 DIAGNOSIS — F3341 Major depressive disorder, recurrent, in partial remission: Secondary | ICD-10-CM | POA: Diagnosis not present

## 2020-06-21 DIAGNOSIS — R5382 Chronic fatigue, unspecified: Secondary | ICD-10-CM | POA: Diagnosis not present

## 2020-06-21 DIAGNOSIS — R030 Elevated blood-pressure reading, without diagnosis of hypertension: Secondary | ICD-10-CM | POA: Diagnosis not present

## 2020-06-21 DIAGNOSIS — R519 Headache, unspecified: Secondary | ICD-10-CM | POA: Diagnosis not present

## 2020-06-21 DIAGNOSIS — F419 Anxiety disorder, unspecified: Secondary | ICD-10-CM | POA: Diagnosis not present

## 2020-06-21 DIAGNOSIS — J208 Acute bronchitis due to other specified organisms: Secondary | ICD-10-CM | POA: Diagnosis not present

## 2020-06-21 DIAGNOSIS — J449 Chronic obstructive pulmonary disease, unspecified: Secondary | ICD-10-CM | POA: Diagnosis not present

## 2020-06-24 DIAGNOSIS — R051 Acute cough: Secondary | ICD-10-CM | POA: Diagnosis not present

## 2020-06-24 DIAGNOSIS — R519 Headache, unspecified: Secondary | ICD-10-CM | POA: Diagnosis not present

## 2020-06-24 DIAGNOSIS — R0602 Shortness of breath: Secondary | ICD-10-CM | POA: Diagnosis not present

## 2020-06-24 DIAGNOSIS — J449 Chronic obstructive pulmonary disease, unspecified: Secondary | ICD-10-CM | POA: Diagnosis not present

## 2020-06-24 DIAGNOSIS — R0600 Dyspnea, unspecified: Secondary | ICD-10-CM | POA: Diagnosis not present

## 2020-06-24 DIAGNOSIS — J209 Acute bronchitis, unspecified: Secondary | ICD-10-CM | POA: Diagnosis not present

## 2020-06-24 DIAGNOSIS — R42 Dizziness and giddiness: Secondary | ICD-10-CM | POA: Diagnosis not present

## 2020-06-24 DIAGNOSIS — J441 Chronic obstructive pulmonary disease with (acute) exacerbation: Secondary | ICD-10-CM | POA: Diagnosis not present

## 2020-06-26 ENCOUNTER — Encounter: Payer: Self-pay | Admitting: Primary Care

## 2020-06-26 ENCOUNTER — Ambulatory Visit (INDEPENDENT_AMBULATORY_CARE_PROVIDER_SITE_OTHER): Payer: Medicare PPO | Admitting: Primary Care

## 2020-06-26 ENCOUNTER — Other Ambulatory Visit: Payer: Self-pay

## 2020-06-26 VITALS — BP 150/78 | HR 88 | Temp 97.0°F | Ht 62.0 in | Wt 128.6 lb

## 2020-06-26 DIAGNOSIS — J449 Chronic obstructive pulmonary disease, unspecified: Secondary | ICD-10-CM

## 2020-06-26 DIAGNOSIS — R053 Chronic cough: Secondary | ICD-10-CM

## 2020-06-26 MED ORDER — STIOLTO RESPIMAT 2.5-2.5 MCG/ACT IN AERS: 2.0000 | INHALATION_SPRAY | Freq: Every day | RESPIRATORY_TRACT | 3 refills | Status: DC

## 2020-06-26 MED ORDER — STIOLTO RESPIMAT 2.5-2.5 MCG/ACT IN AERS: 2.0000 | INHALATION_SPRAY | Freq: Every day | RESPIRATORY_TRACT | 0 refills | Status: DC

## 2020-06-26 NOTE — Patient Instructions (Addendum)
Recommendations: - Add Singulair 10mg  at bedtime - Start flonase OTC daily - Complete Zpack and prednisone taper  - Resume STIOLTO 2 puffs every morning (2 samples and Rx sent) - You can try chlor tabs 4mg  every 4 hours for cough.allergy symptoms  Orders: - Sputum culture - PFTs   Follow-up: - 4 weeks with NP

## 2020-06-26 NOTE — Progress Notes (Signed)
@Patient  ID: , female    DOB: 09/03/1945, 75 y.o.   MRN: [id number]  Chief Complaint  Patient presents with   Follow-up    COPD-    Referring provider: [id number], MD   Brief patient profile:  55  yowf quit smoking 2003 with cough / breathing issues with improved breathing the cough never did resolve completely eval by Chodri and some better spiriva and symbicort then much worse x early fall 2016 felt like coming down with bad cough assoc aches and fever and cough persisted so self referred to pulmonary clinic 01/23/2015    Previous [facility] pulmonary encounter:  05/23/2020  Re-establish  ov/Wert re:  GOLD III COPD  Chief Complaint  Patient presents with   Follow-up   Consult    SOB with exertion, Dry and productive cough, wheezing. Last used albuterol a week ago. 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    Also denies any obvious fluctuation of symptoms with weather or environmental changes or other aggravating or alleviating factors except as outlined above   No unusual exposure hx or h/o childhood pna/ asthma or knowledge of premature birth.   06/26/2020- Interim hx  Patient presents today for 1 month follow-up. Accompanied by her daughter. During last visit Spiriva was changed to 06/28/2020. She was given prednisone taper. She was recently seen at [facility] ED this past weekend and given zpack, prednisone and albuterol nebulizer. She has had a cough for 5 years, which has been worse over the last 1-2 weeks. She does appear to response to  prednisone. Abx ave not seemed to work. Her cough is congested with green mucus. She has some associated wheezing. She is not currently using a maintenance inhaler, she ran out of Stiolto sample several weeks back.    Labs : 05/23/20- IgE 8 05/23/20 - Eosinophils 200   Imaging: 05/24/20 CXR - hyperinflated lungs consistent with COPD or emphysema   No Known Allergies  Immunization History  Administered Date(s) Administered   Influenza-Unspecified 10/13/2017   Moderna Sars-Covid-2 Vaccination 03/11/2019, 04/08/2019    Past Medical History:  Diagnosis Date   Bruises easily    COPD (chronic obstructive pulmonary disease) (HCC)    MVP (mitral valve prolapse)     Tobacco History: Social History   Tobacco Use  Smoking Status Former   Packs/day: 2.00   Years: 50.00   Pack years: 100.00   Types: Cigarettes   Quit date: 01/13/2001   Years since quitting: 19.4  Smokeless Tobacco Never   Counseling given: Not Answered   Outpatient Medications Prior to Visit  Medication Sig Dispense Refill   albuterol (PROAIR HFA) 108 (90 Base) MCG/ACT inhaler 2 puffs every 4 hours as needed only  if your can't catch your breath 1 each 11   azithromycin (ZITHROMAX) 250 MG tablet Take by mouth daily.     escitalopram (LEXAPRO) 10 MG tablet Take 10 mg by mouth daily.  famotidine (PEPCID) 20 MG tablet One after supper 30 tablet 11   Multiple Vitamin (MULTIVITAMIN) tablet Take 1 tablet by mouth daily. VIT B AND VIT D/PATIENTNOT SURE OF THE MG/LC     pantoprazole (PROTONIX) 40 MG tablet Take 1 tablet (40 mg total) by mouth daily. Take 30-60 min before first meal of the day 30 tablet 2   predniSONE (DELTASONE) 10 MG tablet Take  4 each am x 2 days,   2 each am x 2 days,  1 each am x 2 days and stop 14 tablet 0   chlorpheniramine-HYDROcodone (TUSSIONEX) 10-8 MG/5ML SUER Take 5 mLs by mouth 2 (two) times daily as needed.     fluticasone (FLONASE) 50 MCG/ACT nasal spray Place into the nose.      Tiotropium Bromide-Olodaterol 2.5-2.5 MCG/ACT AERS Inhale into the lungs.     Facility-Administered Medications Prior to Visit  Medication Dose Route Frequency Provider Last Rate Last Admin   triamcinolone acetonide (KENALOG) 10 MG/ML injection 10 mg  10 mg Other Once Alvan Dame, DPM        Review of Systems  Review of Systems  Constitutional: Negative.   HENT:  Positive for congestion and postnasal drip.   Respiratory:  Positive for cough, shortness of breath and wheezing.     Physical Exam  BP (!) 150/78 (BP Location: Left Arm, Patient Position: Sitting, Cuff Size: Normal)   Pulse 88   Temp (!) 97 F (36.1 C) (Temporal)   Ht 5\' 2"  (1.575 m)   Wt 128 lb 9.6 oz (58.3 kg)   SpO2 99%   BMI 23.52 kg/m  Physical Exam Constitutional:      Appearance: Normal appearance.  HENT:     Head: Normocephalic and atraumatic.     Mouth/Throat:     Mouth: Mucous membranes are moist.     Pharynx: Oropharynx is clear.  Cardiovascular:     Rate and Rhythm: Normal rate and regular rhythm.  Pulmonary:     Breath sounds: Wheezing present. No rhonchi or rales.  Neurological:     General: No focal deficit present.     Mental Status: She is alert and oriented to person, place, and time. Mental status is at baseline.  Psychiatric:        Mood and Affect: Mood normal.        Behavior: Behavior normal.        Thought Content: Thought content normal.        Judgment: Judgment normal.     Lab Results:  CBC    Component Value Date/Time   WBC 7.2 05/23/2020 1309   RBC 4.38 05/23/2020 1309   HGB 13.2 05/23/2020 1309   HCT 38.4 05/23/2020 1309   PLT 214.0 05/23/2020 1309   MCV 87.7 05/23/2020 1309   MCHC 34.3 05/23/2020 1309   RDW 14.1 05/23/2020 1309   LYMPHSABS 1.4 05/23/2020 1309   MONOABS 0.5 05/23/2020 1309   EOSABS 0.2 05/23/2020 1309   BASOSABS 0.1 05/23/2020 1309    BMET No results found for: NA, K, CL, CO2, GLUCOSE, BUN, CREATININE, CALCIUM, GFRNONAA, GFRAA  BNP No  results found for: BNP  ProBNP No results found for: PROBNP  Imaging: No results found.   Assessment & Plan:   COPD GOLD III - Patient has had a persistent cough for several year, worse over the last couple of weeks. Associated wheezing. Cough is productive with purulent mucus. No better with doxycycline or Zpack. She ran out of Stiolto sample several weeks  ago. We will resume Stiolto today. Recommend she start Singulair and flonase for PND likely contributing to cough. I suspect she has a degree of asthma as her PFTs in 2009 showed +BD response and she seems to respond to oral steriod. We will get updated PFTs and check a sputum culture. No further abx at this time, she can finished azithromycin that was prescribed by [facility] ED. Consider changing LABA/LABA to [facility] if symptoms do not improve with plan.  Recommendations: - Add Singulair 10mg  at bedtime - Start flonase OTC daily - Complete Zpack and prednisone taper  - Resume STIOLTO 2 puffs every morning (2 samples and Rx sent) - You can try chlor tabs 4mg  every 4 hours for cough/allergy symptoms  Orders: - Sputum culture - PFTs/ FENO   Follow-up: - 4 weeks with NP   >35-40 spent face to face with patient   , NP 06/26/2020

## 2020-06-26 NOTE — Assessment & Plan Note (Addendum)
-   Patient has had a persistent cough for several year, worse over the last couple of weeks. Associated wheezing. Cough is productive with purulent mucus. No better with doxycycline or Zpack. She ran out of Stiolto sample several weeks ago. We will resume Stiolto today. Recommend she start Singulair and flonase for PND likely contributing to cough. I suspect she has a degree of asthma as her PFTs in 2009 showed +BD response and she seems to respond to oral steriod. We will get updated PFTs and check a sputum culture. No further abx at this time, she can finished azithromycin that was prescribed by [facility] ED. Consider changing LABA/LABA to [facility] if symptoms do not improve with plan.  Recommendations: - Add Singulair 10mg  at bedtime - Start flonase OTC daily - Complete Zpack and prednisone taper  - Resume STIOLTO 2 puffs every morning (2 samples and Rx sent) - You can try chlor tabs 4mg  every 4 hours for cough/allergy symptoms  Orders: - Sputum culture - PFTs/ FENO   Follow-up: - 4 weeks with NP

## 2020-06-27 ENCOUNTER — Other Ambulatory Visit: Payer: Medicare PPO

## 2020-06-27 DIAGNOSIS — R053 Chronic cough: Secondary | ICD-10-CM

## 2020-06-30 LAB — RESPIRATORY CULTURE OR RESPIRATORY AND SPUTUM CULTURE
MICRO NUMBER:: 12010866
RESULT:: NORMAL
SPECIMEN QUALITY:: ADEQUATE

## 2020-07-04 ENCOUNTER — Encounter: Payer: Self-pay | Admitting: *Deleted

## 2020-07-04 NOTE — Progress Notes (Signed)
Sputum culture showed growth of normal oropharyngeal flora

## 2020-07-23 ENCOUNTER — Other Ambulatory Visit (HOSPITAL_COMMUNITY): Payer: Medicare PPO

## 2020-07-25 ENCOUNTER — Encounter: Payer: Self-pay | Admitting: Primary Care

## 2020-07-25 ENCOUNTER — Ambulatory Visit (INDEPENDENT_AMBULATORY_CARE_PROVIDER_SITE_OTHER): Payer: Medicare PPO | Admitting: Internal Medicine

## 2020-07-25 ENCOUNTER — Other Ambulatory Visit: Payer: Self-pay

## 2020-07-25 ENCOUNTER — Ambulatory Visit: Payer: Medicare PPO | Admitting: Primary Care

## 2020-07-25 DIAGNOSIS — J449 Chronic obstructive pulmonary disease, unspecified: Secondary | ICD-10-CM

## 2020-07-25 DIAGNOSIS — R053 Chronic cough: Secondary | ICD-10-CM

## 2020-07-25 LAB — PULMONARY FUNCTION TEST
DL/VA % pred: 120 %
DL/VA: 5.03 ml/min/mmHg/L
DLCO cor % pred: 94 %
DLCO cor: 16.91 ml/min/mmHg
DLCO unc % pred: 94 %
DLCO unc: 16.91 ml/min/mmHg
FEF 25-75 Post: 0.58 L/sec
FEF 25-75 Pre: 0.47 L/sec
FEF2575-%Change-Post: 24 %
FEF2575-%Pred-Post: 36 %
FEF2575-%Pred-Pre: 29 %
FEV1-%Change-Post: 8 %
FEV1-%Pred-Post: 51 %
FEV1-%Pred-Pre: 47 %
FEV1-Post: 1.01 L
FEV1-Pre: 0.93 L
FEV1FVC-%Change-Post: -1 %
FEV1FVC-%Pred-Pre: 74 %
FEV6-%Change-Post: 8 %
FEV6-%Pred-Post: 72 %
FEV6-%Pred-Pre: 66 %
FEV6-Post: 1.8 L
FEV6-Pre: 1.65 L
FEV6FVC-%Change-Post: -1 %
FEV6FVC-%Pred-Post: 103 %
FEV6FVC-%Pred-Pre: 105 %
FVC-%Change-Post: 10 %
FVC-%Pred-Post: 70 %
FVC-%Pred-Pre: 63 %
FVC-Post: 1.83 L
FVC-Pre: 1.65 L
Post FEV1/FVC ratio: 55 %
Post FEV6/FVC ratio: 98 %
Pre FEV1/FVC ratio: 56 %
Pre FEV6/FVC Ratio: 100 %
RV % pred: 264 %
RV: 5.73 L
TLC % pred: 160 %
TLC: 7.64 L

## 2020-07-25 MED ORDER — MONTELUKAST SODIUM 10 MG PO TABS: 10.0000 mg | ORAL_TABLET | Freq: Every day | ORAL | 5 refills | Status: DC

## 2020-07-25 MED ORDER — TRELEGY ELLIPTA 100-62.5-25 MCG/INH IN AEPB: 1.0000 | INHALATION_SPRAY | Freq: Every day | RESPIRATORY_TRACT | 0 refills | Status: DC

## 2020-07-25 NOTE — Patient Instructions (Signed)
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 When you have COPD, it is hard for air to get in and out ofyour lungs. Usually the condition gets worse over time, and your lungs will never return tonormal. There are things you can do to keep yourself as healthy as possible. What are the causes? Smoking. This is the most common cause. Certain genes passed from parent to child (inherited). What increases the risk? Being exposed to secondhand smoke from cigarettes, pipes, or cigars. Being exposed to chemicals and other irritants, such as fumes and dust in the work environment. Having chronic lung conditions or infections. What are the signs or symptoms? Shortness of breath, especially during physical activity. A long-term cough with a large amount of thick mucus. Sometimes, the cough may not have any mucus (dry cough). Wheezing. Breathing quickly. Skin that looks gray or blue, especially in the fingers, toes, or lips. Feeling tired (fatigue). Weight loss. Chest tightness. Having infections often. Episodes when breathing symptoms become much worse (exacerbations). At the later stages of this disease, you may have swelling in the ankles, feet,or legs. How is this treated? Taking medicines. Quitting smoking, if you smoke. Rehabilitation. This includes steps to make your body work better. It may involve a team of specialists. Doing exercises. Making changes to your diet. Using oxygen. Lung surgery. Lung transplant. Comfort measures (palliative care). Follow these instructions at home: Medicines Take over-the-counter and prescription medicines only as told by your doctor. Talk to your doctor before  taking any cough or allergy medicines. You may need to avoid medicines that cause your lungs to be dry. Lifestyle If you smoke, stop smoking. Smoking makes the problem worse. Do not smoke or use any products that contain nicotine or tobacco. If you need help quitting, ask your doctor. Avoid being around things that make your breathing worse. This may include smoke, chemicals, and fumes. Stay active, but remember to rest as well. Learn and use tips on how to manage stress and control your breathing. Make sure you get enough sleep. Most adults need at least 7 hours of sleep every night. Eat healthy foods. Eat smaller meals more often. Rest before meals. Controlled breathing Learn and use tips on how to control your breathing as told by your doctor. Try: Breathing in (inhaling) through your nose for 1 second. Then, pucker your lips and breath out (exhale) through your lips for 2 seconds. Putting one hand on your belly (abdomen). Breathe in slowly through your nose for 1 second. Your hand on your belly should move out. Pucker your lips and breathe out slowly through your lips. Your hand on your belly should move in as you breathe out.  Controlled coughing Learn and use controlled coughing to clear mucus from your lungs. Follow these steps: Lean your head a little forward. Breathe in deeply. Try to hold your breath for 3 seconds. Keep your mouth slightly open while coughing 2 times. Spit any mucus out into a tissue. Rest and do the steps again 1 or 2 times as needed. General instructions Make sure you get all the shots (vaccines) that your doctor recommends. Ask your doctor about a flu shot and a pneumonia shot. Use oxygen therapy and pulmonary rehabilitation if told by your doctor. If  you need home oxygen therapy, ask your doctor if you should buy a tool to measure your oxygen level (oximeter). Make a COPD action plan with your doctor. This helps you to know what to do if you feel worse than  usual. Manage any other conditions you have as told by your doctor. Avoid going outside when it is very hot, cold, or humid. Avoid people who have a sickness you can catch (contagious). Keep all follow-up visits. Contact a doctor if: You cough up more mucus than usual. There is a change in the color or thickness of the mucus. It is harder to breathe than usual. Your breathing is faster than usual. You have trouble sleeping. You need to use your medicines more often than usual. You have trouble doing your normal activities such as getting dressed or walking around the house. Get help right away if: You have shortness of breath while resting. You have shortness of breath that stops you from: Being able to talk. Doing normal activities. Your chest hurts for longer than 5 minutes. Your skin color is more blue than usual. Your pulse oximeter shows that you have low oxygen for longer than 5 minutes. You have a fever. You feel too tired to breathe normally. These symptoms may represent a serious problem that is an emergency. Do not wait to see if the symptoms will go away. Get medical help right away. Call your local emergency services (911 in the [place] Do not drive yourself to the hospital. Summary Chronic obstructive pulmonary disease (COPD) is a long-term lung problem. The way your lungs work will never return to normal. Usually the condition gets worse over time. There are things you can do to keep yourself as healthy as possible. Take over-the-counter and prescription medicines only as told by your doctor. If you smoke, stop. Smoking makes the problem worse. This information is not intended to replace advice given to you by your health care provider. Make sure you discuss any questions you have with your healthcare provider. Document Revised: 11/08/2019 Document Reviewed: 11/08/2019 Elsevier Patient Education  2022 Elsevier Inc.    Fluticasone; Umeclidinium; Vilanterol inhalation  powder What is this medication? FLUTICASONE; UMECLIDINIUM; VILANTEROL (floo TIK a sone; ue MEK li DIN ee um; vye LAN ter ol) inhalation is a combination of 3 drugs to treat COPD and asthma. Umeclidinium and Vilanterol are bronchodilators that help keep airways open. Fluticasone decreases inflammation in the lungs. Do not use this drugcombination for acute asthma attacks or bronchospasm. This medicine may be used for other purposes; ask your health care provider orpharmacist if you have questions. COMMON BRAND NAME(S): TRELEGY ELLIPTA What should I tell my care team before I take this medication? They need to know if you have any of these conditions: bone problems diabetes eye disease, vision problems heart disease high blood pressure history of irregular heartbeat immune system problems infection kidney disease pheochromocytoma prostate disease seizures thyroid disease trouble passing urine an unusual or allergic reaction to fluticasone, umeclidinium, vilanterol, lactose, milk proteins, other medicines, foods, dyes, or preservatives pregnant or trying to get pregnant breast-feeding How should I use this medication? This drug is inhaled through the mouth. Rinse your mouth with water after use. Make sure not to swallow the water. Take it as directed on the prescriptionlabel at the same time every day. Do not use it more often than directed. A special MedGuide will be given to you by the pharmacist with eachprescription and refill. Be sure to read this information carefully  each time. Talk to your pediatrician about the use of this drug in children. Special caremay be needed. Overdosage: If you think you have taken too much of this medicine contact apoison control center or emergency room at once. NOTE: This medicine is only for you. Do not share this medicine with others. What if I miss a dose? If you miss a dose, take it as soon as you can. If it is almost time for yournext dose, take  only that dose. Do not take double or extra doses. What may interact with this medication? Do not take this medicine with any of the following medications: cisapride dofetilide dronedarone MAOIs like Carbex, Eldepryl, Marplan, Nardil, and Parnate pimozide thioridazine ziprasidone This medicine may also interact with the following medications: aclidinium antihistamines for allergy antiviral medicines for HIV or AIDS atropine beta-blockers like metoprolol and propranolol certain antibiotics like clarithromycin and telithromycin certain medicines for bladder problems like oxybutynin, tolterodine certain medicines for depression, anxiety, or psychotic disturbances certain medicines for fungal infections like ketoconazole, itraconazole, posaconazole, voriconazole certain medicines for Parkinson's disease like benztropine, trihexyphenidyl certain medicines for stomach problems like dicyclomine, hyoscyamine certain medicines for travel sickness like scopolamine conivaptan diuretics ipratropium medicines for colds other medicines for breathing problems other medicines that prolong the QT interval (cause an abnormal heart rhythm) nefazodone tiotropium This list may not describe all possible interactions. Give your health care provider a list of all the medicines, herbs, non-prescription drugs, or dietary supplements you use. Also tell them if you smoke, drink alcohol, or use illegaldrugs. Some items may interact with your medicine. What should I watch for while using this medication? Visit your doctor or health care professional for regular checkups. Tell your doctor or health care professional if your symptoms do not get better. Do notuse this medicine more than once every 24 hours. NEVER use this medicine for an acute asthma or COPD attack. You should use your short-acting rescue inhalers for this purpose. If your symptoms get worse or ifyou need your short-acting inhalers more often, call  your doctor right away. If you are going to have surgery tell your doctor or health care professional that you are using this medicine. Try not to come in contact with people withthe chicken pox or measles. If you do, call your doctor. This medicine may increase blood sugar. Ask your healthcare provider if changesin diet or medicines are needed if you have diabetes. What side effects may I notice from receiving this medication? Side effects that you should report to your doctor or health care professionalas soon as possible: allergic reactions like skin rash or hives, swelling of the face, lips, or tongue breathing problems right after inhaling your medicine chest pain eye pain fast, irregular heartbeat feeling faint or lightheaded, falls fever or chills nausea, vomiting signs and symptoms of high blood sugar such as being more thirsty or hungry or having to urinate more than normal. You may also feel very tired or have blurry vision. trouble passing urine Side effects that usually do not require medical attention (report these toyour doctor or health care professional if they continue or are bothersome): back pain changes in taste cough diarrhea headache nervousness sore throat tremor This list may not describe all possible side effects. Call your doctor for medical advice about side effects. You may report side effects to FDA at1-800-FDA-1088. Where should I keep my medication? Keep out of the reach of children and pets. Store at room temperature between 20 and 25 degrees C (68  and 77 degrees F). Keep inhaler away from extreme heat, cold or humidity. Throw away 6 weeks after removing it from the foil pouch, when the dose counter reads "0" or after theexpiration date, whichever is first. NOTE: This sheet is a summary. It may not cover all possible information. If you have questions about this medicine, talk to your doctor, pharmacist, orhealth care provider.  2022 Elsevier/Gold  Standard (2018-11-08 12:45:04)   Montelukast Tablets What is this medication? MONTELUKAST (mon te LOO kast) prevents and treats the symptoms of asthma and allergies. It works by decreasing inflammation in the airways, making it easierto breathe. Do not use this medication to treat a sudden asthma attack. This medicine may be used for other purposes; ask your health care provider orpharmacist if you have questions. COMMON BRAND NAME(S): Singulair What should I tell my care team before I take this medication? They need to know if you have any of these conditions: Liver disease An unusual or allergic reaction to montelukast, other medications, foods, dyes, or preservatives Pregnant or trying to get pregnant Breast-feeding How should I use this medication? Take this medication by mouth with water. Take it as directed on the prescription label at the same time every day. You can take this medication with or without food. If it upsets your stomach, take it with food. Keep takingit unless your care team tells you to stop. A special MedGuide will be given to you by the pharmacist with eachprescription and refill. Be sure to read this information carefully each time. Talk to your care team about the use of this medication in children. While this medication may be prescribed for children as young as 15 years for selectedconditions, precautions do apply. Overdosage: If you think you have taken too much of this medicine contact apoison control center or emergency room at once. NOTE: This medicine is only for you. Do not share this medicine with others. What if I miss a dose? If you miss a dose, skip it. Take your next dose at the normal time. Do nottake extra or 2 doses at the same time to make up for the missed dose. What may interact with this medication? Medications for seizures like phenytoin, phenobarbital, and carbamazepine Rifabutin Rifampin This list may not describe all possible interactions.  Give your health care provider a list of all the medicines, herbs, non-prescription drugs, or dietary supplements you use. Also tell them if you smoke, drink alcohol, or use illegaldrugs. Some items may interact with your medicine. What should I watch for while using this medication? Visit your health care provider for regular checks on your progress. Tell your health care provider if your allergy or asthma symptoms do not improve. Takeyour medication even when you do not have symptoms. If you have asthma, talk to your health care provider about what to do in an acute asthma attack. Always have your rescue medication for asthma attacks withyou. Patients and their families should watch for new or worsening thoughts of suicide or depression. Also watch for sudden changes in feelings such as feeling anxious, agitated, panicky, irritable, hostile, aggressive, impulsive, severely restless, overly excited and hyperactive, or not being able to sleep. Any worsening of mood or thoughts of suicide or dying should be reported toyour health care provider right away. What side effects may I notice from receiving this medication? Side effects that you should report to your care team as soon as possible: Allergic reactions-skin rash, itching, hives, swelling of the face, lips, tongue, or throat  Flu-like symptoms-fever, chills, muscle pain, cough, headache, fatigue Mood and behavior changes such as anxiety, nervousness, confusion, hallucinations, irritability, hostility, thoughts of suicide or self-harm, worsening mood, feelings of depression Pain, tingling, or numbness in the hands or feet Sinus pain or pressure around the face or forehead Trouble sleeping Vivid dreams or nightmares Side effects that usually do not require medical attention (report to your careteam if they continue or are bothersome): Cough Diarrhea Headache Runny or stuffy nose Sore throat Stomach pain This list may not describe all possible  side effects. Call your doctor for medical advice about side effects. You may report side effects to FDA at1-800-FDA-1088. Where should I keep my medication? Keep out of the reach of children and pets. Store at room temperature between 15 and 30 degrees C (59 and 86 degrees F). Protect from light and moisture. Protect from light and moisture. Keep the container tightly closed. Get rid of any unused medication after the expirationdate. To get rid of medications that are no longer needed or expired: Take the medication to a medication take-back program. Check with your pharmacy or law enforcement to find a location. If you cannot return the medication, check the label or package insert to see if the medication should be thrown out in the garbage or flushed down the toilet. If you are not sure, ask your care team. If it is safe to put in the trash, empty the medication out of the container. Mix the medication with cat litter, dirt, coffee grounds, or other unwanted substance. Seal the mixture in a bag or container. Put it in the trash. NOTE: This sheet is a summary. It may not cover all possible information. If you have questions about this medicine, talk to your doctor, pharmacist, orhealth care provider.  2022 Elsevier/Gold Standard (2020-01-27 11:01:47)

## 2020-07-25 NOTE — Progress Notes (Signed)
@Patient  ID: , female    DOB: 1945-12-03, 75 y.o.   MRN: [id number]  Chief Complaint  Patient presents with   Follow-up    PFT review.     Referring provider: [id number], MD  Brief patient profile:  65  yowf quit smoking 2003 with cough / breathing issues with improved breathing the cough never did resolve completely eval by Chodri and some better spiriva and symbicort then much worse x early fall 2016 felt like coming down with bad cough assoc aches and fever and cough persisted so self referred to pulmonary clinic 01/23/2015    Previous [facility] pulmonary encounter:  05/23/2020  Re-establish  ov/Wert re:  GOLD III COPD  Chief Complaint  Patient presents with   Follow-up   Consult    SOB with exertion, Dry and productive cough, wheezing. Last used albuterol a week ago. 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   Also denies any obvious fluctuation of symptoms with weather or environmental changes or other aggravating or alleviating factors except as outlined above   No unusual exposure hx or h/o childhood pna/ asthma or knowledge of premature birth.   06/26/2020 Patient presents today for 1 month follow-up. Accompanied by her daughter. During last visit Spiriva was changed to 06/28/2020. She was given prednisone taper. She was recently seen at [facility] ED this past weekend and given zpack, prednisone and albuterol nebulizer. She has had a cough for 5 years, which has been worse over the last 1-2 weeks. She does appear to response to prednisone.  Abx ave not seemed to work. Her cough is congested with green mucus. She has some associated wheezing. She is not currently using a maintenance inhaler, she ran out of Stiolto sample several weeks back.   07/25/2020 Interim hx  Patient presents today for 1 month follow-up with PFTs. She has been using Stiolto and Flonase as directed. Her cough improved temporarily but returned shortly after so went to [facility] ED with complaints of headaches. Results are not available in epic but appears there were no acute findings. She has been strictly following GERD diet. Sputum samples showed growth of normal oropharyngeal flora. Pulmonary function testing today showed moderate-severe obstruction.   PFTs 01/04/08   FEV1  1.29 (58%) ratio 60 p 18% response to saba 07/25/2020 - FEV1 1.01 (51%), ratio 55, TLC 160%, DLCO16.91 (94%)  Labs : 05/23/20- IgE 8 05/23/20 - Eosinophils 200   Imaging: 05/24/20 CXR - hyperinflated lungs consistent with COPD or emphysema  No Known Allergies  Immunization History  Administered Date(s) Administered   Influenza-Unspecified 10/13/2017   Moderna Sars-Covid-2 Vaccination 03/11/2019, 04/08/2019    Past Medical History:  Diagnosis Date   Bruises easily    COPD (chronic obstructive pulmonary disease) (HCC)    MVP (mitral valve prolapse)     Tobacco History: Social History   Tobacco Use  Smoking Status Former   Packs/day: 2.00   Years: 50.00  Pack years: 100.00   Types: Cigarettes   Quit date: 01/13/2001   Years since quitting: 19.5  Smokeless Tobacco Never   Counseling given: Not Answered   Outpatient Medications Prior to Visit  Medication Sig Dispense Refill   albuterol (PROAIR HFA) 108 (90 Base) MCG/ACT inhaler 2 puffs every 4 hours as needed only  if your can't catch your breath 1 each 11   azithromycin (ZITHROMAX) 250 MG tablet Take by mouth daily.     chlorpheniramine-HYDROcodone (TUSSIONEX) 10-8 MG/5ML SUER Take 5 mLs by mouth 2 (two) times daily as  needed.     escitalopram (LEXAPRO) 10 MG tablet Take 10 mg by mouth daily.     famotidine (PEPCID) 20 MG tablet One after supper 30 tablet 11   fluticasone (FLONASE) 50 MCG/ACT nasal spray Place into the nose.     Multiple Vitamin (MULTIVITAMIN) tablet Take 1 tablet by mouth daily. VIT B AND VIT D/PATIENTNOT SURE OF THE MG/LC     pantoprazole (PROTONIX) 40 MG tablet Take 1 tablet (40 mg total) by mouth daily. Take 30-60 min before first meal of the day 30 tablet 2   Tiotropium Bromide-Olodaterol (STIOLTO RESPIMAT) 2.5-2.5 MCG/ACT AERS Inhale 2 puffs into the lungs daily. 4 g 3   Tiotropium Bromide-Olodaterol 2.5-2.5 MCG/ACT AERS Inhale into the lungs.     Tiotropium Bromide-Olodaterol (STIOLTO RESPIMAT) 2.5-2.5 MCG/ACT AERS Inhale 2 puffs into the lungs daily. 4 g 0   predniSONE (DELTASONE) 10 MG tablet Take  4 each am x 2 days,   2 each am x 2 days,  1 each am x 2 days and stop (Patient not taking: Reported on 07/25/2020) 14 tablet 0   Facility-Administered Medications Prior to Visit  Medication Dose Route Frequency Provider Last Rate Last Admin   triamcinolone acetonide (KENALOG) 10 MG/ML injection 10 mg  10 mg Other Once Alvan Dame, DPM        Review of Systems  Review of Systems  Constitutional: Negative.   HENT: Negative.    Respiratory:  Positive for cough.     Physical Exam  BP 138/74 (BP Location: Right Arm, Patient Position: Sitting, Cuff Size: Normal)   Pulse 77   Ht 5\' 2"  (1.575 m)   Wt 123 lb (55.8 kg)   SpO2 95%   BMI 22.50 kg/m  Physical Exam Constitutional:      Appearance: Normal appearance.  HENT:     Head: Normocephalic and atraumatic.     Mouth/Throat:     Mouth: Mucous membranes are moist.     Pharynx: Oropharynx is clear.  Cardiovascular:     Rate and Rhythm: Normal rate and regular rhythm.  Pulmonary:     Effort: Pulmonary effort is normal.     Breath sounds: No wheezing or rhonchi.  Musculoskeletal:        General: Normal range of motion.   Neurological:     General: No focal deficit present.     Mental Status: She is alert and oriented to person, place, and time. Mental status is at baseline.     Lab Results:  CBC    Component Value Date/Time   WBC 7.2 05/23/2020 1309   RBC 4.38 05/23/2020 1309   HGB 13.2 05/23/2020 1309   HCT 38.4 05/23/2020 1309   PLT 214.0 05/23/2020 1309   MCV 87.7 05/23/2020 1309   MCHC 34.3 05/23/2020 1309   RDW 14.1 05/23/2020 1309   LYMPHSABS 1.4 05/23/2020 1309   MONOABS 0.5 05/23/2020 1309   EOSABS  0.2 05/23/2020 1309   BASOSABS 0.1 05/23/2020 1309    BMET No results found for: NA, K, CL, CO2, GLUCOSE, BUN, CREATININE, CALCIUM, GFRNONAA, GFRAA  BNP No results found for: BNP  ProBNP No results found for: PROBNP  Imaging: No results found.   Assessment & Plan:   COPD GOLD III - Former smoker quit in 2003. Reports persistent productive cough with purulent mucus. Sputum culture was consistent with normal oropharyngeal flora. 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 Recommend trial Trelegy 100 one puff daily.  Headache is lisited ans possible adverse reaction in 4-9%. Sending in RX for Singulair 10mg  at bedtime.    , NP 07/25/2020

## 2020-07-25 NOTE — Assessment & Plan Note (Addendum)
-   Former smoker quit in 2003. Reports persistent productive cough with purulent mucus. Sputum culture was consistent with normal oropharyngeal flora. 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 Recommend trial Trelegy 100 one puff daily.  Headache is lisited ans possible adverse reaction in 4-9%. Sending in RX for Singulair 10mg  at bedtime.

## 2020-07-25 NOTE — Progress Notes (Signed)
Full PFT performed today. °

## 2020-07-25 NOTE — Patient Instructions (Signed)
Full PFT performed today. °

## 2020-08-07 ENCOUNTER — Ambulatory Visit: Payer: Medicare PPO | Admitting: Internal Medicine

## 2020-08-15 ENCOUNTER — Ambulatory Visit: Payer: Medicare PPO | Admitting: Primary Care

## 2020-08-19 ENCOUNTER — Other Ambulatory Visit: Payer: Self-pay | Admitting: Internal Medicine

## 2020-08-28 ENCOUNTER — Encounter: Payer: Self-pay | Admitting: Primary Care

## 2020-08-28 ENCOUNTER — Ambulatory Visit (INDEPENDENT_AMBULATORY_CARE_PROVIDER_SITE_OTHER): Payer: Medicare PPO | Admitting: Primary Care

## 2020-08-28 ENCOUNTER — Other Ambulatory Visit: Payer: Self-pay

## 2020-08-28 DIAGNOSIS — J449 Chronic obstructive pulmonary disease, unspecified: Secondary | ICD-10-CM | POA: Diagnosis not present

## 2020-08-28 MED ORDER — BREZTRI AEROSPHERE 160-9-4.8 MCG/ACT IN AERO: 2.0000 | INHALATION_SPRAY | Freq: Two times a day (BID) | RESPIRATORY_TRACT | 0 refills | Status: DC

## 2020-08-28 NOTE — Patient Instructions (Signed)
Recommendations: - Stop Stiolto - Start [employer]- take two puffs morning and evening (rinse mouth after use) - Take Delsym over the counter q 12 hours for cough - Continue Protonix and Pepcid - Continue Singulair 10mg  at bedtime  - Let know how you are tolerating Breztri in 2 weeks and if you would like RX sent   Follow-up: - Nov 7th at 10:45 with Dr. Nov 9

## 2020-08-28 NOTE — Addendum Note (Signed)
Addended by: Arvilla Market on: 08/28/2020 10:33 AM   Modules accepted: Orders

## 2020-08-28 NOTE — Progress Notes (Signed)
Virtual Visit via Telephone Note  I connected with Lynn Harris on 08/28/20 at  9:30 AM EDT by telephone and verified that I am speaking with the correct person using two identifiers.  Location: Patient: Home Provider: Office   I discussed the limitations, risks, security and privacy concerns of performing an evaluation and management service by telephone and the availability of in person appointments. I also discussed with the patient that there may be a patient responsible charge related to this service. The patient expressed understanding and agreed to proceed.   History of Present Illness:  Brief patient profile:  79  yowf quit smoking 2003 with cough / breathing issues with improved breathing the cough never did resolve completely eval by Chodri and some better spiriva and symbicort then much worse x early fall 2016 felt like coming down with bad cough assoc aches and fever and cough persisted so self referred to pulmonary clinic 01/23/2015    Previous [facility] pulmonary encounter:  05/23/2020  Re-establish  ov/Wert re:  GOLD III COPD  Chief Complaint  Patient presents with   Follow-up   Consult    SOB with exertion, Dry and productive cough, wheezing. Last used albuterol a week ago. 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   Also denies any obvious fluctuation of symptoms with weather or environmental changes or other aggravating or alleviating factors except as outlined above   No unusual exposure hx or h/o childhood pna/ asthma or knowledge of  premature birth.   06/26/2020 Patient presents today for 1 month follow-up. Accompanied by her daughter. During last visit Spiriva was changed to [employer]. She was given prednisone taper. She was recently seen at [facility] ED this past weekend and given zpack, prednisone and albuterol nebulizer. She has had a cough for 5 years, which has been worse over the last 1-2 weeks. She does appear to response to prednisone. Abx ave not seemed to work. Her cough is congested with green mucus. She has some associated wheezing. She is not currently using a maintenance inhaler, she ran out of Stiolto sample several weeks back.   07/25/2020  Patient presents today for 1 month follow-up with PFTs. She has been using Stiolto and Flonase as directed. Her cough improved temporarily but returned shortly after so went to [facility] ED with complaints of headaches. Results are not available in epic but appears there were no acute findings. She has been strictly following GERD diet. Sputum samples showed growth of normal oropharyngeal flora. Pulmonary function testing today showed moderate-severe obstruction.    08/28/2020 Interim hx  Patient contacted today for virtual visit. Pulmonary function testing showed moderate-severe obstruction with borderline BD response and normal diffusion capacity. During her last visit we started her on Trelegy and Singulair d/t persistent cough. Trelegy helped her cough but gave her a headache and dry mouth. Her headache resolve after stopping. Her cough is sporadic, worse in the morning. Cough is productive with clear  mucus. She is still wheezing some and is sore from coughing so much. She is complaint with Protonix 40mg  daily and Pepcid at bedtime.   PFTs: 01/04/08   FEV1  1.29 (58%) ratio 60 p 18% response to saba 07/25/2020 - FEV1 1.01 (51%), ratio 55 p 8% response to saba *patient took stiolto prior   Labs: 05/23/20- IgE 8 05/23/20 - Eosinophils 200   Imaging: 05/24/20 CXR -  hyperinflated lungs consistent with COPD or emphysema    Observations/Objective:  - Able to speak in full sentences; no overt shortness of breath or wheezing  Assessment and Plan:  Moderate COPD: - Persistent cough. PFTs have shown moderate-severe obstruction with borderline BD response and normal diffusion capacity. She did not tolerate Trelegy 07/24/20 d/t headache but cough improved. Recommend trial Breztri Aerosphere 2 puffs twice daily.  Follow Up Instructions:  - 3 months with Dr.    I discussed the assessment and treatment plan with the patient. The patient was provided an opportunity to ask questions and all were answered. The patient agreed with the plan and demonstrated an understanding of the instructions.   The patient was advised to call back or seek an in-person evaluation if the symptoms worsen or if the condition fails to improve as anticipated.  I provided 25 minutes of non-face-to-face time during this encounter.   Sherene Sires, NP

## 2020-08-31 DIAGNOSIS — F419 Anxiety disorder, unspecified: Secondary | ICD-10-CM | POA: Diagnosis not present

## 2020-08-31 DIAGNOSIS — G629 Polyneuropathy, unspecified: Secondary | ICD-10-CM | POA: Diagnosis not present

## 2020-08-31 DIAGNOSIS — J302 Other seasonal allergic rhinitis: Secondary | ICD-10-CM | POA: Diagnosis not present

## 2020-08-31 DIAGNOSIS — E785 Hyperlipidemia, unspecified: Secondary | ICD-10-CM | POA: Diagnosis not present

## 2020-08-31 DIAGNOSIS — I10 Essential (primary) hypertension: Secondary | ICD-10-CM | POA: Diagnosis not present

## 2020-08-31 DIAGNOSIS — J449 Chronic obstructive pulmonary disease, unspecified: Secondary | ICD-10-CM | POA: Diagnosis not present

## 2020-08-31 DIAGNOSIS — F3341 Major depressive disorder, recurrent, in partial remission: Secondary | ICD-10-CM | POA: Diagnosis not present

## 2020-08-31 DIAGNOSIS — G8929 Other chronic pain: Secondary | ICD-10-CM | POA: Diagnosis not present

## 2020-08-31 DIAGNOSIS — G4733 Obstructive sleep apnea (adult) (pediatric): Secondary | ICD-10-CM | POA: Diagnosis not present

## 2020-10-24 ENCOUNTER — Telehealth: Payer: Self-pay | Admitting: Primary Care

## 2020-10-24 MED ORDER — BREZTRI AEROSPHERE 160-9-4.8 MCG/ACT IN AERO: 2.0000 | INHALATION_SPRAY | Freq: Two times a day (BID) | RESPIRATORY_TRACT | 6 refills | Status: DC

## 2020-10-24 NOTE — Telephone Encounter (Signed)
Call made to patient, confirmed DOB. Confirmed medication and pharmacy. Refill sent. Nothing further needed at this time.  

## 2020-11-15 DIAGNOSIS — J449 Chronic obstructive pulmonary disease, unspecified: Secondary | ICD-10-CM | POA: Diagnosis not present

## 2020-11-15 DIAGNOSIS — Z6822 Body mass index (BMI) 22.0-22.9, adult: Secondary | ICD-10-CM | POA: Diagnosis not present

## 2020-11-19 ENCOUNTER — Ambulatory Visit: Payer: Medicare PPO | Admitting: Internal Medicine

## 2020-11-23 ENCOUNTER — Other Ambulatory Visit: Payer: Self-pay | Admitting: Internal Medicine

## 2020-11-26 ENCOUNTER — Other Ambulatory Visit: Payer: Self-pay

## 2020-11-26 ENCOUNTER — Ambulatory Visit: Payer: Medicare PPO | Admitting: Internal Medicine

## 2020-11-26 ENCOUNTER — Encounter: Payer: Self-pay | Admitting: Internal Medicine

## 2020-11-26 DIAGNOSIS — J449 Chronic obstructive pulmonary disease, unspecified: Secondary | ICD-10-CM

## 2020-11-26 DIAGNOSIS — R058 Other specified cough: Secondary | ICD-10-CM | POA: Diagnosis not present

## 2020-11-26 NOTE — Progress Notes (Signed)
Subjective:    Patient ID: Lynn Harris, female    DOB: 01/06/1946,   MRN: [id number]    Brief patient profile:  63 yowf quit smoking 2003 with cough / breathing issues with improved breathing the cough never did resolve completely eval by Chodri and some better spiriva and symbicort then much worse x early fall 2016 felt like coming down with bad cough assoc aches and fever and cough persisted so self referred to pulmonary clinic 01/23/2015      History of Present Illness  01/23/2015 1st [facility] office visit/ Jalina Blowers   Chief Complaint  Patient presents with   Pulmonary Consult    Self referral for COPD. Pt states that she was dxed with COPD in 2007. She c/o increased cough and SOB for the past 4 months. Cough is prod with clear/white to yellow/green sputum.  She states that she gets SOB with doing housework and "when I get too hot".    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  Please remember to go to the x-ray department downstairs for your tests - we will call you with the results when they are available. Please schedule a follow up office visit in 4 weeks, sooner if needed When return bring your medications in 2 separate bags, the ones you take no matter(automatically)  what vs the as needed (only when you feel you  need them)      02/26/2015  f/u ov/[name] re:    Copd GOLD III criteria/chronic cough /  maint stiolto / ppi qam ac / no pred did not bring all meds  Chief Complaint  Patient presents with   Follow-up    Cough is some better. Her breathing has also improved. No new co's today.   still coughing esp early in am but breathing has improved on stiolto/ newly disclosed has been taking fosfamax chronically / not taking pepcid ac hs as rec. Cough remains mostly nonproductive  rec Try off the fosamax until cough is 100% gone to your satisfaction  Start Pepcid ac 20 mg at bedtime to see to what extent it helps your am cough over the next 6 weeks   05/23/2020  Re-establish  ov/[name] re:  GOLD III COPD  Chief Complaint  Patient presents with   Follow-up   Consult    SOB with exertion, Dry and productive cough, wheezing. Last used albuterol a week ago. 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  Rec Stop powdered spiriva Prednisone 10 mg take  4 each am x 2 days,   2 each am x 2 days,  1 each am x 2 days and stop  Plan A = Automatic = Always=    Stiolto one puff each am  Work on inhaler technique:   Plan B = Backup (to supplement plan A, not to replace it) Only use your albuterol inhaler as a rescue medication Stop fosamax  Pantoprazole (protonix) 40 mg   Take  30-60 min before first meal of the day and Pepcid (famotidine)  20 mg after supper until return to office  GERD diet/lifestyle   08/28/20 [facility] NP rx  Recommendations: - Stop Stiolto - Start [employer]- take two puffs morning and evening (rinse mouth after use) - Take Delsym over the counter q 12 hours for cough - Continue Protonix and Pepcid - Continue  Singulair 10mg  at bedtime   11/26/2020  f/u ov/[name] re: GOLD 3 copd maint on breztri  Chief Complaint  Patient presents with   Follow-up    Doing well om Breztri  Dyspnea:  can do [employer] shopping / uses HC occ [place]  slow pace  Cough: occ coughing fits daytime up to twice weekly/ assoc pnds not singulair min mucoid  Sleeping: flat bed on side/ one pillow  no resp problem ? Restless legs  SABA use: none  02: none  Covid status:   vax x 3/ never infected    No obvious day to day or daytime variability or assoc excess/ purulent sputum or mucus plugs or hemoptysis or cp or chest tightness, subjective wheeze or overt sinus or hb symptoms.   Sleeping  without nocturnal  or early am exacerbation  of respiratory  c/o's or need for noct saba. Also denies any obvious fluctuation of symptoms with weather or environmental changes or other aggravating or alleviating factors except as outlined above   No unusual exposure hx or h/o childhood pna/ asthma or knowledge of premature birth.  Current Allergies, Complete Past Medical History, Past Surgical History, Family History, and Social History were reviewed in [employer] record.  ROS  The following are not active complaints unless bolded Hoarseness, sore throat, dysphagia, dental problems, itching, sneezing,  nasal congestion or discharge of excess mucus or purulent secretions, ear ache,   fever, chills, sweats, unintended wt loss or wt gain, classically pleuritic or exertional cp,  orthopnea pnd or arm/hand swelling  or leg swelling, presyncope, palpitations, abdominal pain, anorexia, nausea, vomiting, diarrhea  or change in bowel habits or change in bladder habits, change in stools or change in urine, dysuria, hematuria,  rash, arthralgias, visual complaints, headache, numbness, weakness or ataxia or problems with walking or coordination,  change in mood or  memory.        Current Meds  Medication Sig   albuterol  (PROAIR HFA) 108 (90 Base) MCG/ACT inhaler 2 puffs every 4 hours as needed only  if your can't catch your breath   Budeson-Glycopyrrol-Formoterol (BREZTRI AEROSPHERE) 160-9-4.8 MCG/ACT AERO Inhale 2 puffs into the lungs in the morning and at bedtime.   Budeson-Glycopyrrol-Formoterol (BREZTRI AEROSPHERE) 160-9-4.8 MCG/ACT AERO Inhale 2 puffs into the lungs in the morning and at bedtime.   escitalopram (LEXAPRO) 10 MG tablet Take 10 mg by mouth daily.   famotidine (PEPCID) 20 MG tablet One after supper   fluticasone (  FLONASE) 50 MCG/ACT nasal spray Place into the nose.   montelukast (SINGULAIR) 10 MG tablet Take 1 tablet (10 mg total) by mouth at bedtime.   Multiple Vitamin (MULTIVITAMIN) tablet Take 1 tablet by mouth daily. VIT B AND VIT D/PATIENTNOT SURE OF THE MG/LC   pantoprazole (PROTONIX) 40 MG tablet TAKE 1 TABLET BY MOUTH ONCE DAILY. TAKE 30-60 MINUTES BEFORE FIRST MEAL OF THE DAY.   Current Facility-Administered Medications for the 11/26/20 encounter (Office Visit) with Tanda Rockers, MD  Medication   triamcinolone acetonide (KENALOG) 10 MG/ML injection 10 mg            Objective:   Physical Exam     11/26/2020     125  05/23/2020       127   02/26/15 134 lb 12.8 oz (61.145 kg)  01/23/15 137 lb 6.4 oz (62.324 kg)     Vital signs reviewed  11/26/2020  - Note at rest 02 sats  96% on RA   General appearance:    amb pleasant wf, seems somewhat hyperkinetic   HEENT : pt wearing mask not removed for exam due to covid - 19 concerns.   NECK :  without JVD/Nodes/TM/ nl carotid upstrokes bilaterally   LUNGS: no acc muscle use,  Min barrel  contour chest wall with bilateral  slightly decreased bs s audible wheeze and  without cough on insp or exp maneuvers and min  Hyperresonant  to  percussion bilaterally     CV:  RRR  no s3 or murmur or increase in P2, and no edema   ABD:  soft and nontender with pos end  insp Hoover's  in the supine position. No bruits or organomegaly  appreciated, bowel sounds nl  MS:   Nl gait/  ext warm without deformities, calf tenderness, cyanosis or clubbing No obvious joint restrictions   SKIN: warm and dry without lesions    NEURO:  alert, approp, nl sensorium with  no motor or cerebellar deficits apparent.          Assessment & Plan:

## 2020-11-26 NOTE — Patient Instructions (Signed)
Start back on singulair for a month   For drainage or sinus problems best zyrtec 10 mg each evening for a month and if better then just it as needed for drippy nose    Please schedule a follow up visit in 6 months but call sooner if needed

## 2020-11-26 NOTE — Assessment & Plan Note (Signed)
Quit smoking 2003  -  PFTs 01/04/08   FEV1  1.29 (58%) ratio 60 p 18% response to saba - 01/23/2015    try stiolto 2 each am - Spirometry 02/26/2015  FEV1 0.86 (41%)  ratio 51 -   05/23/2020   Walked RA  3laps @ approx 26ft each @ moderate pace  stopped due to end of study with mild  sob and sats still 97%  Allergy profile 05/23/2020 >  Eos 0.2 /  IgE  8 - 05/23/2020  After extensive coaching inhaler device,  effectiveness =    75% > restart stiolto but just one puff daily and d/c dpi spiriva as cough is one of the main problem - 11/26/2020 reported improvement on breztri 2bid    Group D in terms of symptom/risk and laba/lama/ICS  therefore appropriate rx at this point >>>  Continue breztri and prn saba

## 2020-11-26 NOTE — Assessment & Plan Note (Addendum)
Max rx for gerd and off fosamax 02/26/2015 >>> ? Response - Allergy profile 05/23/2020 >  Eos 0.2 /  IgE  8 - 05/23/2020 retry off fosamax and max gerd rx > did not stop fosamax as of 11/26/2020  - 11/26/2020 trial of singulair/ zyrtec x one month and if better p 30 d change zyrtec to prn   Upper airway cough syndrome (previously labeled PNDS),  is so named because it's frequently impossible to sort out how much is  CR/sinusitis with freq throat clearing (which can be related to primary GERD)   vs  causing  secondary (" extra esophageal")  GERD from wide swings in gastric pressure that occur with throat clearing, often  promoting self use of mint and menthol lozenges that reduce the lower esophageal sphincter tone and exacerbate the problem further in a cyclical fashion.   These are the same pts (now being labeled as having "irritable larynx syndrome" by some cough centers) who not infrequently have a history of having failed to tolerate ace inhibitors,  dry powder inhalers or biphosphonates(fosamax)  or report having atypical/extraesophageal reflux symptoms that don't respond to standard doses of PPI  and are easily confused as having aecopd or asthma flares by even experienced allergists/ pulmonologists (myself included).   If cough continues on this rx would again do trial off fosamax for a minimum of 6 weeks           Each maintenance medication was reviewed in detail including emphasizing most importantly the difference between maintenance and prns and under what circumstances the prns are to be triggered using an action plan format where appropriate.  Total time for H and P, chart review, counseling, reviewing hfa device(s) and generating customized AVS unique to this office visit / same day charting > 30 min

## 2020-12-03 DIAGNOSIS — R0981 Nasal congestion: Secondary | ICD-10-CM | POA: Diagnosis not present

## 2020-12-03 DIAGNOSIS — J209 Acute bronchitis, unspecified: Secondary | ICD-10-CM | POA: Diagnosis not present

## 2020-12-03 DIAGNOSIS — J440 Chronic obstructive pulmonary disease with acute lower respiratory infection: Secondary | ICD-10-CM | POA: Diagnosis not present

## 2020-12-31 DIAGNOSIS — J40 Bronchitis, not specified as acute or chronic: Secondary | ICD-10-CM | POA: Diagnosis not present

## 2020-12-31 DIAGNOSIS — J329 Chronic sinusitis, unspecified: Secondary | ICD-10-CM | POA: Diagnosis not present

## 2020-12-31 DIAGNOSIS — J449 Chronic obstructive pulmonary disease, unspecified: Secondary | ICD-10-CM | POA: Diagnosis not present

## 2021-01-31 DIAGNOSIS — L821 Other seborrheic keratosis: Secondary | ICD-10-CM | POA: Diagnosis not present

## 2021-01-31 DIAGNOSIS — L578 Other skin changes due to chronic exposure to nonionizing radiation: Secondary | ICD-10-CM | POA: Diagnosis not present

## 2021-01-31 DIAGNOSIS — C44729 Squamous cell carcinoma of skin of left lower limb, including hip: Secondary | ICD-10-CM | POA: Diagnosis not present

## 2021-02-07 ENCOUNTER — Other Ambulatory Visit: Payer: Self-pay | Admitting: Primary Care

## 2021-02-15 DIAGNOSIS — G2581 Restless legs syndrome: Secondary | ICD-10-CM | POA: Diagnosis not present

## 2021-02-15 DIAGNOSIS — E785 Hyperlipidemia, unspecified: Secondary | ICD-10-CM | POA: Diagnosis not present

## 2021-02-15 DIAGNOSIS — Z6822 Body mass index (BMI) 22.0-22.9, adult: Secondary | ICD-10-CM | POA: Diagnosis not present

## 2021-02-15 DIAGNOSIS — M810 Age-related osteoporosis without current pathological fracture: Secondary | ICD-10-CM | POA: Diagnosis not present

## 2021-02-21 ENCOUNTER — Other Ambulatory Visit: Payer: Self-pay | Admitting: Internal Medicine

## 2021-03-07 ENCOUNTER — Other Ambulatory Visit: Payer: Self-pay | Admitting: Internal Medicine

## 2021-04-01 DIAGNOSIS — C44729 Squamous cell carcinoma of skin of left lower limb, including hip: Secondary | ICD-10-CM | POA: Diagnosis not present

## 2021-04-23 DIAGNOSIS — Z6822 Body mass index (BMI) 22.0-22.9, adult: Secondary | ICD-10-CM | POA: Diagnosis not present

## 2021-04-23 DIAGNOSIS — B370 Candidal stomatitis: Secondary | ICD-10-CM | POA: Diagnosis not present

## 2021-04-23 DIAGNOSIS — J449 Chronic obstructive pulmonary disease, unspecified: Secondary | ICD-10-CM | POA: Diagnosis not present

## 2021-04-30 DIAGNOSIS — C44729 Squamous cell carcinoma of skin of left lower limb, including hip: Secondary | ICD-10-CM | POA: Diagnosis not present

## 2021-05-06 DIAGNOSIS — H35373 Puckering of macula, bilateral: Secondary | ICD-10-CM | POA: Diagnosis not present

## 2021-05-29 DIAGNOSIS — Z1331 Encounter for screening for depression: Secondary | ICD-10-CM | POA: Diagnosis not present

## 2021-05-29 DIAGNOSIS — Z139 Encounter for screening, unspecified: Secondary | ICD-10-CM | POA: Diagnosis not present

## 2021-05-29 DIAGNOSIS — Z9181 History of falling: Secondary | ICD-10-CM | POA: Diagnosis not present

## 2021-05-29 DIAGNOSIS — E785 Hyperlipidemia, unspecified: Secondary | ICD-10-CM | POA: Diagnosis not present

## 2021-05-29 DIAGNOSIS — Z0000 Encounter for general adult medical examination without abnormal findings: Secondary | ICD-10-CM | POA: Diagnosis not present

## 2021-06-14 DIAGNOSIS — R42 Dizziness and giddiness: Secondary | ICD-10-CM | POA: Diagnosis not present

## 2021-06-14 DIAGNOSIS — R0989 Other specified symptoms and signs involving the circulatory and respiratory systems: Secondary | ICD-10-CM | POA: Diagnosis not present

## 2021-06-14 DIAGNOSIS — Z6822 Body mass index (BMI) 22.0-22.9, adult: Secondary | ICD-10-CM | POA: Diagnosis not present

## 2021-06-19 ENCOUNTER — Ambulatory Visit: Payer: Medicare PPO | Admitting: Internal Medicine

## 2021-06-19 ENCOUNTER — Encounter: Payer: Self-pay | Admitting: Internal Medicine

## 2021-06-19 DIAGNOSIS — J449 Chronic obstructive pulmonary disease, unspecified: Secondary | ICD-10-CM | POA: Diagnosis not present

## 2021-06-19 MED ORDER — BREZTRI AEROSPHERE 160-9-4.8 MCG/ACT IN AERO: INHALATION_SPRAY | RESPIRATORY_TRACT | 11 refills | Status: DC

## 2021-06-19 NOTE — Assessment & Plan Note (Addendum)
Quit smoking 2003  -  PFTs 01/04/08   FEV1  1.29 (58%) ratio 60 p 18% response to saba - 01/23/2015    try stiolto 2 each am - Spirometry 02/26/2015  FEV1 0.86 (41%)  ratio 51 -   05/23/2020   Walked RA  3laps @ approx 279ft each @ moderate pace  stopped due to end of study with mild  sob and sats still 97%  Allergy profile 05/23/2020 >  Eos 0.2 /  IgE  8 - 05/23/2020  After extensive coaching inhaler device,  effectiveness =    75% > restart stiolto but just one puff daily and d/c dpi spiriva as cough is one of the main problem - Labs ordered 06/19/2021  :  alpha one AT phenotype   - 06/19/2021   Walked on RA  x  3  lap(s) =  approx 750  ft  @ mod pace, stopped due to end of study, no sob with lowest 02 sats 93%    Group D (now reclassified as E) in terms of symptom/risk and laba/lama/ICS  therefore appropriate rx at this point >>>  breztri  2bid and approp saba  >>> f/u yearly - call sooner if needed          Each maintenance medication was reviewed in detail including emphasizing most importantly the difference between maintenance and prns and under what circumstances the prns are to be triggered using an action plan format where appropriate.  Total time for H and P, chart review, counseling, reviewing hfa device(s) , directly observing portions of ambulatory 02 saturation study/ and generating customized AVS unique to this office visit / same day charting =25 min

## 2021-06-19 NOTE — Progress Notes (Signed)
Subjective:    Patient ID: Lynn Harris, female    DOB: July 14, 1945    MRN: [id number]    Brief patient profile:  53 yowf quit smoking 2003 with cough / breathing issues with improved breathing the cough never did resolve completely eval by Chodri and some better spiriva and symbicort then much worse x early fall 2016 felt like coming down with bad cough assoc aches and fever and cough persisted so self referred to pulmonary clinic 01/23/2015      History of Present Illness  01/23/2015 1st [facility] office visit/ Lynn Harris   Chief Complaint  Patient presents with   Pulmonary Consult    Self referral for COPD. Pt states that she was dxed with COPD in 2007. She c/o increased cough and SOB for the past 4 months. Cough is prod with clear/white to yellow/green sputum.  She states that she gets SOB with doing housework and "when I get too hot".    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  Please remember to go to the x-ray department downstairs for your tests - we will call you with the results when they are available. Please schedule a follow up office visit in 4 weeks, sooner if needed When return bring your medications in 2 separate bags, the ones you take no matter(automatically)  what vs the as needed (only when you feel you  need them)    05/23/2020  Re-establish  ov/Lynn Harris re:  GOLD III COPD  Chief Complaint  Patient presents with   Follow-up   Consult    SOB with exertion, Dry and productive cough, wheezing. Last used albuterol a week ago. 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  Rec Stop powdered spiriva Prednisone 10 mg take  4 each am x 2 days,   2 each am x 2 days,  1 each am x 2 days and stop  Plan A = Automatic = Always=    Stiolto one puff each am  Work on inhaler technique:   Plan B = Backup (to supplement plan A, not to replace it) Only use your albuterol inhaler as a rescue medication Stop fosamax  Pantoprazole (protonix) 40 mg   Take  30-60 min before first meal of the day and Pepcid (famotidine)  20 mg after supper until return to office  GERD  diet/lifestyle     11/26/2020  f/u ov/Lynn Harris re: GOLD 3 copd maint on breztri  Chief Complaint  Patient presents with   Follow-up    Doing well om Breztri  Dyspnea:  can do [employer] shopping / uses HC occ walking neighborhood  slow pace  Cough: occ coughing fits daytime up to twice weekly/ assoc pnds not singulair min mucoid  Sleeping: flat bed on side/ one pillow  no resp problem ? Restless legs  SABA use: none  02: none  Covid status:   vax x 3/ never infected  Rec Start back on singulair for a month  For drainage or sinus problems best zyrtec 10 mg each evening for a month and if better then just it as needed for drippy nose     06/19/2021  f/u ov/Lynn Harris re: GOLD 3 COPD  maint on breztri   Chief Complaint  Patient presents with   Follow-up    Pt states she has been doing okay since last visit and denies any real complaints.  Dyspnea:  still [employer] walking is about as  much as she does  Cough: none  Sleeping: flat bed/ one pillow SABA use: once a month 02: none     No obvious day to day or daytime variability or assoc excess/ purulent sputum or mucus plugs or hemoptysis or cp or chest tightness, subjective wheeze or overt sinus or hb symptoms.   Sleeping  without nocturnal  or early am exacerbation  of respiratory  c/o's or need for noct saba. Also denies any obvious fluctuation of symptoms with weather or environmental changes or other aggravating or alleviating factors except as outlined above   No unusual exposure hx or h/o childhood pna/ asthma or knowledge of premature birth.  Current Allergies, Complete Past Medical History, Past Surgical History, Family History, and Social History were reviewed in [employer] record.  ROS  The following are not active complaints unless bolded Hoarseness, sore throat, dysphagia, dental problems, itching, sneezing,  nasal congestion or discharge of excess mucus or purulent secretions, ear ache,   fever, chills, sweats, unintended wt loss or wt gain, classically pleuritic or exertional cp,  orthopnea pnd or arm/hand swelling  or leg swelling, presyncope, palpitations, abdominal pain, anorexia, nausea, vomiting, diarrhea  or change in bowel habits or change in bladder habits, change in stools or change in urine, dysuria, hematuria,  rash, arthralgias, visual complaints, headache, numbness, weakness or ataxia or problems with walking or coordination,  change in mood or  memory.        Current Meds  Medication Sig   albuterol (PROAIR HFA) 108 (90 Base) MCG/ACT inhaler 2 puffs every 4 hours as needed only  if your can't catch your breath   alendronate (FOSAMAX) 70 MG tablet Take 70 mg by mouth once a week.   Budeson-Glycopyrrol-Formoterol (BREZTRI AEROSPHERE) 160-9-4.8 MCG/ACT AERO Inhale 2 puffs into the lungs in the morning and at bedtime.   escitalopram (LEXAPRO) 10 MG tablet Take 10 mg by mouth  daily.   famotidine (PEPCID) 20 MG tablet One after supper   fluticasone (FLONASE) 50 MCG/ACT nasal spray Place into the nose.   meclizine (ANTIVERT) 25 MG tablet Take 25 mg by mouth every 8 (eight) hours.   montelukast (SINGULAIR) 10 MG tablet TAKE 1 TABLET BY MOUTH AT BEDTIME   Multiple Vitamin (MULTIVITAMIN) tablet Take 1 tablet by mouth daily. VIT B AND VIT D/PATIENTNOT SURE OF THE MG/LC   pantoprazole (PROTONIX) 40 MG tablet TAKE  1 TABLET BY MOUTH ONCE DAILY. TAKE 30 TO 60 MINUTES BEFORE FIRST MEAL OF THE DAY   Current Facility-Administered Medications for the 06/19/21 encounter (Office Visit) with Tanda Rockers, MD  Medication   triamcinolone acetonide (KENALOG) 10 MG/ML injection 10 mg              Objective:   Physical Exam    06/19/2021          123  11/26/2020     125  05/23/2020       127   02/26/15 134 lb 12.8 oz (61.145 kg)  01/23/15 137 lb 6.4 oz (62.324 kg)    Vital signs reviewed  06/19/2021  - Note at rest 02 sats  98% on RA   General appearance:    amb elderly wf nad      HEENT : Oropharynx  clear  Nasal turbinates nl    NECK :  without  apparent JVD/ palpable Nodes/TM    LUNGS: no acc muscle use,  Min barrel  contour chest wall with bilateral  slightly decreased bs s audible wheeze and  without cough on insp or exp maneuvers and min  Hyperresonant  to  percussion bilaterally    CV:  RRR  no s3 or murmur or increase in P2, and no edema   ABD:  soft and nontender with pos end  insp Hoover's  in the supine position.  No bruits or organomegaly appreciated   MS:  Nl gait/ ext warm without deformities Or obvious joint restrictions  calf tenderness, cyanosis or clubbing     SKIN: warm and dry without lesions    NEURO:  alert, approp, nl sensorium with  no motor or cerebellar deficits apparent.               Assessment & Plan:

## 2021-06-19 NOTE — Patient Instructions (Signed)
No change in medications  Please remember to go to the lab department   for your tests - we will call you with the results when they are available.      Please schedule a follow up visit in 12  months but call sooner if needed

## 2021-06-21 DIAGNOSIS — I6523 Occlusion and stenosis of bilateral carotid arteries: Secondary | ICD-10-CM | POA: Diagnosis not present

## 2021-06-21 DIAGNOSIS — R0989 Other specified symptoms and signs involving the circulatory and respiratory systems: Secondary | ICD-10-CM | POA: Diagnosis not present

## 2021-06-22 LAB — ALPHA-1-ANTITRYPSIN PHENOTYP: A-1 Antitrypsin: 126 mg/dL (ref 101–187)

## 2021-06-24 NOTE — Progress Notes (Signed)
Spoke with pt and notified of results per Dr. Wert. Pt verbalized understanding and denied any questions. 

## 2021-07-22 DIAGNOSIS — J40 Bronchitis, not specified as acute or chronic: Secondary | ICD-10-CM | POA: Diagnosis not present

## 2021-07-22 DIAGNOSIS — J329 Chronic sinusitis, unspecified: Secondary | ICD-10-CM | POA: Diagnosis not present

## 2021-12-23 DIAGNOSIS — M2560 Stiffness of unspecified joint, not elsewhere classified: Secondary | ICD-10-CM | POA: Diagnosis not present

## 2021-12-23 DIAGNOSIS — M5489 Other dorsalgia: Secondary | ICD-10-CM | POA: Diagnosis not present

## 2021-12-23 DIAGNOSIS — R2689 Other abnormalities of gait and mobility: Secondary | ICD-10-CM | POA: Diagnosis not present

## 2021-12-31 DIAGNOSIS — R2689 Other abnormalities of gait and mobility: Secondary | ICD-10-CM | POA: Diagnosis not present

## 2021-12-31 DIAGNOSIS — M5489 Other dorsalgia: Secondary | ICD-10-CM | POA: Diagnosis not present

## 2021-12-31 DIAGNOSIS — M2560 Stiffness of unspecified joint, not elsewhere classified: Secondary | ICD-10-CM | POA: Diagnosis not present

## 2022-01-01 DIAGNOSIS — M545 Low back pain, unspecified: Secondary | ICD-10-CM | POA: Diagnosis not present

## 2022-01-02 DIAGNOSIS — R2689 Other abnormalities of gait and mobility: Secondary | ICD-10-CM | POA: Diagnosis not present

## 2022-01-02 DIAGNOSIS — M5489 Other dorsalgia: Secondary | ICD-10-CM | POA: Diagnosis not present

## 2022-01-02 DIAGNOSIS — M2560 Stiffness of unspecified joint, not elsewhere classified: Secondary | ICD-10-CM | POA: Diagnosis not present

## 2022-01-08 DIAGNOSIS — M5489 Other dorsalgia: Secondary | ICD-10-CM | POA: Diagnosis not present

## 2022-01-08 DIAGNOSIS — R2689 Other abnormalities of gait and mobility: Secondary | ICD-10-CM | POA: Diagnosis not present

## 2022-01-08 DIAGNOSIS — M2560 Stiffness of unspecified joint, not elsewhere classified: Secondary | ICD-10-CM | POA: Diagnosis not present

## 2022-01-15 DIAGNOSIS — M5489 Other dorsalgia: Secondary | ICD-10-CM | POA: Diagnosis not present

## 2022-01-15 DIAGNOSIS — R2689 Other abnormalities of gait and mobility: Secondary | ICD-10-CM | POA: Diagnosis not present

## 2022-01-15 DIAGNOSIS — M2560 Stiffness of unspecified joint, not elsewhere classified: Secondary | ICD-10-CM | POA: Diagnosis not present

## 2022-01-21 DIAGNOSIS — M2560 Stiffness of unspecified joint, not elsewhere classified: Secondary | ICD-10-CM | POA: Diagnosis not present

## 2022-01-21 DIAGNOSIS — M5489 Other dorsalgia: Secondary | ICD-10-CM | POA: Diagnosis not present

## 2022-01-21 DIAGNOSIS — R2689 Other abnormalities of gait and mobility: Secondary | ICD-10-CM | POA: Diagnosis not present

## 2022-01-22 DIAGNOSIS — M5136 Other intervertebral disc degeneration, lumbar region: Secondary | ICD-10-CM | POA: Diagnosis not present

## 2022-01-22 DIAGNOSIS — M8438XD Stress fracture, other site, subsequent encounter for fracture with routine healing: Secondary | ICD-10-CM | POA: Diagnosis not present

## 2022-01-28 ENCOUNTER — Telehealth (HOSPITAL_COMMUNITY): Payer: Self-pay

## 2022-01-28 NOTE — Telephone Encounter (Signed)
Ok per Dr. Estanislado Pandy for bilateral sacroplasty. AB

## 2022-02-03 ENCOUNTER — Telehealth (HOSPITAL_COMMUNITY): Payer: Self-pay

## 2022-02-03 ENCOUNTER — Other Ambulatory Visit (HOSPITAL_COMMUNITY): Payer: Self-pay | Admitting: Interventional Radiology

## 2022-02-03 DIAGNOSIS — M8448XA Pathological fracture, other site, initial encounter for fracture: Secondary | ICD-10-CM

## 2022-02-03 NOTE — Telephone Encounter (Signed)
Pt agreed to sacroplasty. I've sent a request to insurance company. I will call back to schedule. AB

## 2022-02-03 NOTE — Telephone Encounter (Signed)
Called to schedule sacroplasty consult, no answer, left vm. AB

## 2022-02-05 ENCOUNTER — Other Ambulatory Visit: Payer: Self-pay | Admitting: Radiology

## 2022-02-05 DIAGNOSIS — M549 Dorsalgia, unspecified: Secondary | ICD-10-CM

## 2022-02-06 ENCOUNTER — Other Ambulatory Visit: Payer: Self-pay | Admitting: Student

## 2022-02-06 ENCOUNTER — Encounter (HOSPITAL_COMMUNITY): Payer: Self-pay

## 2022-02-06 NOTE — H&P (Signed)
Chief Complaint: Bilateral sarcal  insufficiency fracture  Referring Physician(s): Dr/ Nelva Bush  Supervising Physician: Luanne Bras  Patient Status: [facility] - Out-pt  History of Present Illness: Lynn Harris is a 77 y.o. female  outpatient. History of MVP, COPD, osteoporosis. Chronic back pain after fall that occurred in October 2023. 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 Patient presents for bilateral sacroplasty.  Currently without any significant complaints. Patient alert and laying in bed,calm. Denies any fevers, headache, chest pain, SOB, cough, abdominal pain, nausea, vomiting or bleeding.Return precautions and treatment recommendations and follow-up discussed with the patient  who is agreeable with the plan.    Past Medical History:  Diagnosis Date   Bruises easily    COPD (chronic obstructive pulmonary disease) (HCC)    MVP (mitral valve prolapse)     Past Surgical History:  Procedure Laterality Date   CHOLECYSTECTOMY     CHOLECYSTECTOMY     SHOULDER SURGERY      Allergies: Trelegy ellipta [fluticasone-umeclidin-vilant]  Medications: Prior to Admission medications   Medication Sig Start Date End Date Taking? Authorizing Provider  albuterol (PROAIR HFA) 108 (90 Base) MCG/ACT inhaler 2 puffs every 4 hours as needed only  if your can't catch your breath 05/23/20   Tanda Rockers, MD  alendronate (FOSAMAX) 70 MG tablet Take 70 mg by mouth once a week. 06/07/21   [provider]  Budeson-Glycopyrrol-Formoterol (BREZTRI AEROSPHERE) 160-9-4.8 MCG/ACT AERO Take 2 puffs first thing in am and then another 2 puffs about 12 hours later. 06/19/21   Tanda Rockers, MD  escitalopram (LEXAPRO) 10 MG tablet Take 10 mg by mouth daily.    [provider]  famotidine (PEPCID) 20  MG tablet One after supper 05/23/20   Tanda Rockers, MD  fluticasone [facility]) 50 MCG/ACT nasal spray Place into the nose.    [provider]  meclizine (ANTIVERT) 25 MG tablet Take 25 mg by mouth every 8 (eight) hours. 06/14/21   [provider]  montelukast (SINGULAIR) 10 MG tablet TAKE 1 TABLET BY MOUTH AT BEDTIME 03/07/21   Tanda Rockers, MD  Multiple Vitamin (MULTIVITAMIN) tablet Take 1 tablet by mouth daily. VIT B AND VIT D/PATIENTNOT SURE OF THE MG/LC    [provider]  pantoprazole (PROTONIX) 40 MG tablet TAKE 1 TABLET BY MOUTH ONCE DAILY. TAKE 30 TO 60 MINUTES BEFORE FIRST MEAL OF THE DAY 02/21/21   Tanda Rockers, MD     No family history on file.  Social History   Socioeconomic History   Marital status: Married    Spouse name: Not on file   Number of children: Not on file   Years of education: Not on file   Highest education level: Not on file  Occupational History   Not on file  Tobacco Use   Smoking status: Former    Packs/day: 2.00    Years: 50.00    Total pack years: 100.00    Types: Cigarettes    Quit date: 01/13/2001    Years since quitting: 21.0   Smokeless tobacco: Never  Substance and Sexual Activity   Alcohol use: No    Alcohol/week: 0.0 standard drinks of alcohol   Drug use: No   Sexual activity: Not on file  Other Topics Concern   Not on file  Social History Narrative   Not  on file   Social Determinants of Health   Financial Resource Strain: Not on file  Food Insecurity: Not on file  Transportation Needs: Not on file  Physical Activity: Not on file  Stress: Not on file  Social Connections: Not on file      Review of Systems: A 12 point ROS discussed and pertinent positives are indicated in the HPI above.  All other systems are negative.  Review of Systems  Constitutional:  Negative for fatigue and fever.  HENT:  Negative for congestion.   Respiratory:  Negative for cough and shortness of breath.    Gastrointestinal:  Negative for abdominal pain, diarrhea, nausea and vomiting.    Vital Signs: BP (!) 128/52   Pulse 74   Temp 99.3 F (37.4 C) (Oral)   Resp 14   Ht 5\' 2"  (1.575 m)   Wt 111 lb (50.3 kg)   SpO2 95%   BMI 20.30 kg/m     Physical Exam Vitals and nursing note reviewed.  Constitutional:      Appearance: She is well-developed.  HENT:     Head: Normocephalic and atraumatic.  Eyes:     Conjunctiva/sclera: Conjunctivae normal.  Pulmonary:     Effort: Pulmonary effort is normal.  Musculoskeletal:        General: Normal range of motion.     Cervical back: Normal range of motion.  Skin:    General: Skin is warm.  Neurological:     Mental Status: She is alert and oriented to person, place, and time.     Imaging: No results found.  Labs:  CBC: Recent Labs    02/07/22 0820  WBC 6.5  HGB 12.3  HCT 37.3  PLT 233    COAGS: Recent Labs    02/07/22 0820  INR 1.1    BMP: No results for input(s): "NA", "K", "CL", "CO2", "GLUCOSE", "BUN", "CALCIUM", "CREATININE", "GFRNONAA", "GFRAA" in the last 8760 hours.  Invalid input(s): "CMP"  LIVER FUNCTION TESTS: No results for input(s): "BILITOT", "AST", "ALT", "ALKPHOS", "PROT", "ALBUMIN" in the last 8760 hours.   Assessment and Plan:  77 y.o. female outpatient. History of MVP, COPD, osteoporosis. Chronic back pain after fall that occurred in October 2023. 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 Patient presents for bilateral sacroplasty .   All labs and medications are within acceptable parameters. No pertinent allergies. Patient has been NPO since midnight  Risks and benefits of bilateral sacroplasty were discussed with the patient including, but not limited to bleeding, infection, vascular injury, contrast induced renal  failure, stroke, reperfusion hemorrhage, or even death. This interventional procedure involves the use of X-rays and because of the nature of the planned procedure, it is possible that we will have prolonged use of X-ray fluoroscopy. Potential radiation risks to you include (but are not limited to) the following: - A slightly elevated risk for cancer  several years later in life. This risk is typically less than 0.5% percent. This risk is low in comparison to the normal incidence of human cancer, which is 33% for women and 50% for men according to the American Cancer Society. - Radiation induced injury can include skin redness, resembling a rash, tissue breakdown / ulcers and hair loss (which can be temporary or permanent).  The likelihood of either of these occurring depends on the difficulty of the procedure and  whether you are sensitive to radiation due to previous procedures, disease, or genetic conditions.  IF your procedure requires a prolonged use of radiation, you will be notified and given written instructions for further action.  It is your responsibility to monitor the irradiated area for the 2 weeks following the procedure and to notify your physician if you are concerned that you have suffered a radiation induced injury.   All of the patient's questions were answered, patient is agreeable to proceed. Consent signed and in chart.   Thank you for this interesting consult.  I greatly enjoyed meeting Lynn Harris and look forward to participating in their care.  A copy of this report was sent to the requesting provider on this date.  Electronically Signed: Jacqualine Mau, NP 02/07/2022, 9:15 AM   I spent a total of  30 Minutes   in face to face in clinical consultation, greater than 50% of which was counseling/coordinating care for bilateral sacroplasty

## 2022-02-07 ENCOUNTER — Ambulatory Visit (HOSPITAL_COMMUNITY)
Admission: RE | Admit: 2022-02-07 | Discharge: 2022-02-07 | Disposition: A | Discharge status: Home / Self Care | Payer: Medicare PPO | Source: Ambulatory Visit | Attending: Interventional Radiology | Admitting: Interventional Radiology

## 2022-02-07 ENCOUNTER — Other Ambulatory Visit: Payer: Self-pay

## 2022-02-07 DIAGNOSIS — I341 Nonrheumatic mitral (valve) prolapse: Secondary | ICD-10-CM | POA: Diagnosis not present

## 2022-02-07 DIAGNOSIS — M8088XA Other osteoporosis with current pathological fracture, vertebra(e), initial encounter for fracture: Secondary | ICD-10-CM | POA: Insufficient documentation

## 2022-02-07 DIAGNOSIS — J449 Chronic obstructive pulmonary disease, unspecified: Secondary | ICD-10-CM | POA: Diagnosis not present

## 2022-02-07 DIAGNOSIS — W19XXXA Unspecified fall, initial encounter: Secondary | ICD-10-CM | POA: Insufficient documentation

## 2022-02-07 DIAGNOSIS — M4317 Spondylolisthesis, lumbosacral region: Secondary | ICD-10-CM | POA: Diagnosis not present

## 2022-02-07 DIAGNOSIS — G8929 Other chronic pain: Secondary | ICD-10-CM | POA: Insufficient documentation

## 2022-02-07 DIAGNOSIS — Z87891 Personal history of nicotine dependence: Secondary | ICD-10-CM | POA: Insufficient documentation

## 2022-02-07 DIAGNOSIS — M549 Dorsalgia, unspecified: Secondary | ICD-10-CM

## 2022-02-07 DIAGNOSIS — M808AXA Other osteoporosis with current pathological fracture, other site, initial encounter for fracture: Secondary | ICD-10-CM | POA: Diagnosis not present

## 2022-02-07 DIAGNOSIS — M8448XA Pathological fracture, other site, initial encounter for fracture: Secondary | ICD-10-CM | POA: Diagnosis not present

## 2022-02-07 HISTORY — PX: IR SACROPLASTY BILATERAL: IMG5561

## 2022-02-07 LAB — BASIC METABOLIC PANEL
Anion gap: 8 (ref 5–15)
BUN: 11 mg/dL (ref 8–23)
CO2: 27 mmol/L (ref 22–32)
Calcium: 9.3 mg/dL (ref 8.9–10.3)
Chloride: 104 mmol/L (ref 98–111)
Creatinine, Ser: 0.82 mg/dL (ref 0.44–1.00)
GFR, Estimated: >60 mL/min (ref >60)
Glucose, Bld: 99 mg/dL (ref 70–99)
Potassium: 3.8 mmol/L (ref 3.5–5.1)
Sodium: 139 mmol/L (ref 135–145)

## 2022-02-07 LAB — CBC
HCT: 37.3 % (ref 36.0–46.0)
Hemoglobin: 12.3 g/dL (ref 12.0–15.0)
MCH: 29.3 pg (ref 26.0–34.0)
MCHC: 33 g/dL (ref 30.0–36.0)
MCV: 88.8 fL (ref 80.0–100.0)
Platelets: 233 10*3/uL (ref 150–400)
RBC: 4.2 MIL/uL (ref 3.87–5.11)
RDW: 13.6 % (ref 11.5–15.5)
WBC: 6.5 10*3/uL (ref 4.0–10.5)
nRBC: 0 % (ref 0.0–0.2)

## 2022-02-07 LAB — PROTIME-INR
INR: 1.1 (ref 0.8–1.2)
Prothrombin Time: 14.1 seconds (ref 11.4–15.2)

## 2022-02-07 MED ORDER — FENTANYL CITRATE (PF) 100 MCG/2ML IJ SOLN
INTRAMUSCULAR | Status: AC
  Filled 2022-02-07: qty 2

## 2022-02-07 MED ORDER — BUPIVACAINE HCL (PF) 0.5 % IJ SOLN
INTRAMUSCULAR | Status: AC
  Administered 2022-02-07: 30 mL
  Filled 2022-02-07: qty 30

## 2022-02-07 MED ORDER — FENTANYL CITRATE (PF) 100 MCG/2ML IJ SOLN
INTRAMUSCULAR | Status: AC | PRN
  Administered 2022-02-07: 25 ug via INTRAVENOUS

## 2022-02-07 MED ORDER — IOHEXOL 300 MG/ML  SOLN
50.0000 mL | Freq: Once | INTRAMUSCULAR | Status: AC | PRN
  Administered 2022-02-07: 10 mL

## 2022-02-07 MED ORDER — SODIUM CHLORIDE 0.9 % IV SOLN: INTRAVENOUS | Status: DC

## 2022-02-07 MED ORDER — MIDAZOLAM HCL 2 MG/2ML IJ SOLN
INTRAMUSCULAR | Status: AC | PRN
  Administered 2022-02-07: 1 mg via INTRAVENOUS

## 2022-02-07 MED ORDER — MIDAZOLAM HCL 2 MG/2ML IJ SOLN
INTRAMUSCULAR | Status: AC
  Filled 2022-02-07: qty 2

## 2022-02-07 MED ORDER — SODIUM CHLORIDE 0.9 % IV SOLN: INTRAVENOUS | Status: AC

## 2022-02-07 MED ORDER — CEFAZOLIN SODIUM-DEXTROSE 2-4 GM/100ML-% IV SOLN
INTRAVENOUS | Status: AC
  Administered 2022-02-07: 2 g via INTRAVENOUS
  Filled 2022-02-07: qty 100

## 2022-02-07 MED ORDER — CEFAZOLIN SODIUM-DEXTROSE 2-4 GM/100ML-% IV SOLN: 2.0000 g | INTRAVENOUS | Status: AC

## 2022-02-07 MED ORDER — TOBRAMYCIN SULFATE 1.2 G IJ SOLR
INTRAMUSCULAR | Status: AC
  Administered 2022-02-07: 0.2 g
  Filled 2022-02-07: qty 1.2

## 2022-02-07 MED ORDER — LIDOCAINE HCL 1 % IJ SOLN
INTRAMUSCULAR | Status: AC
  Filled 2022-02-07: qty 20

## 2022-02-07 MED ORDER — BUPIVACAINE HCL (PF) 0.5 % IJ SOLN
INTRAMUSCULAR | Status: AC
  Filled 2022-02-07: qty 30

## 2022-02-07 NOTE — Procedures (Signed)
INR.   .  Status post fluoroscopic  guided sacroplasty at S1.  Bilateral approach.  No acute complications.  Patient tolerated the  procedure well.  Arlean Hopping MD.

## 2022-02-07 NOTE — Discharge Instructions (Addendum)
INR.  1.  No stooping, bending or lifting weights above 10 pounds for 2 weeks.  2.  Use a walker to ambulate for 2 weeks.  3.  No driving for 2 weeks.  4.  See referring MD as needed in 2 weeks.  Arlean Hopping MD      KYPHOPLASTY/VERTEBROPLASTY DISCHARGE INSTRUCTIONS  Medications: (check all that apply)     Resume all home medications as before procedure.                     Continue your pain medications as prescribed as needed.  Over the next 3-5 days, decrease your pain medication as tolerated.  Over the counter medications (i.e. Tylenol, ibuprofen, and aleve) may be substituted once severe/moderate pain symptoms have subsided.   Wound Care: Bandages may be removed the day following your procedure.  You may get your incision wet once bandages are removed.  Bandaids may be used to cover the incisions until scab formation.  Topical ointments are optional.  If you develop a fever greater than 101 degrees, have increased skin redness at the incision sites or pus-like oozing from incisions occurring within 1 week of the procedure, contact radiology at [phone] or [phone].  Ice pack to back for 15-20 minutes 2-3 time per day for first 2-3 days post procedure.  The ice will expedite muscle healing and help with the pain from the incisions.   Activity: Bedrest today with limited activity for 24 hours post procedure.  No driving for 48 hours.  Increase your activity as tolerated after bedrest (with assistance if necessary).  Refrain from any strenuous activity or heavy lifting (greater than 10 lbs.).   Follow up: Contact radiology at [phone] or [phone] if any questions/concerns.  A physician assistant from radiology will contact you in approximately 1 week.  If a biopsy was performed at the time of your procedure, your referring physician should receive the results in usually 2-3 days.

## 2022-02-25 DIAGNOSIS — M5136 Other intervertebral disc degeneration, lumbar region: Secondary | ICD-10-CM | POA: Diagnosis not present

## 2022-02-25 DIAGNOSIS — M8438XD Stress fracture, other site, subsequent encounter for fracture with routine healing: Secondary | ICD-10-CM | POA: Diagnosis not present

## 2022-03-21 DIAGNOSIS — Z0000 Encounter for general adult medical examination without abnormal findings: Secondary | ICD-10-CM | POA: Diagnosis not present

## 2022-03-21 DIAGNOSIS — Z6820 Body mass index (BMI) 20.0-20.9, adult: Secondary | ICD-10-CM | POA: Diagnosis not present

## 2022-03-21 DIAGNOSIS — Z1331 Encounter for screening for depression: Secondary | ICD-10-CM | POA: Diagnosis not present

## 2022-03-21 DIAGNOSIS — M810 Age-related osteoporosis without current pathological fracture: Secondary | ICD-10-CM | POA: Diagnosis not present

## 2022-03-21 DIAGNOSIS — E785 Hyperlipidemia, unspecified: Secondary | ICD-10-CM | POA: Diagnosis not present

## 2022-03-21 DIAGNOSIS — Z79899 Other long term (current) drug therapy: Secondary | ICD-10-CM | POA: Diagnosis not present

## 2022-04-11 ENCOUNTER — Other Ambulatory Visit: Payer: Self-pay | Admitting: Internal Medicine

## 2022-04-11 NOTE — Telephone Encounter (Signed)
OK refill montelukast x 3.  Per Dr. Melvyn Novas office note:  Please schedule a follow up visit in 12  months but call sooner if needed (around 06/20/2022)

## 2022-06-20 ENCOUNTER — Ambulatory Visit: Payer: Medicare PPO | Admitting: Internal Medicine

## 2022-06-28 IMAGING — DX DG CHEST 2V
2 series · 2 of 2 positions shown · non-contrast
Comparison: September 17, 2018 rib films.

CLINICAL DATA: Cough.  COPD.

EXAM:
CHEST - 2 VIEW

[chest pa]
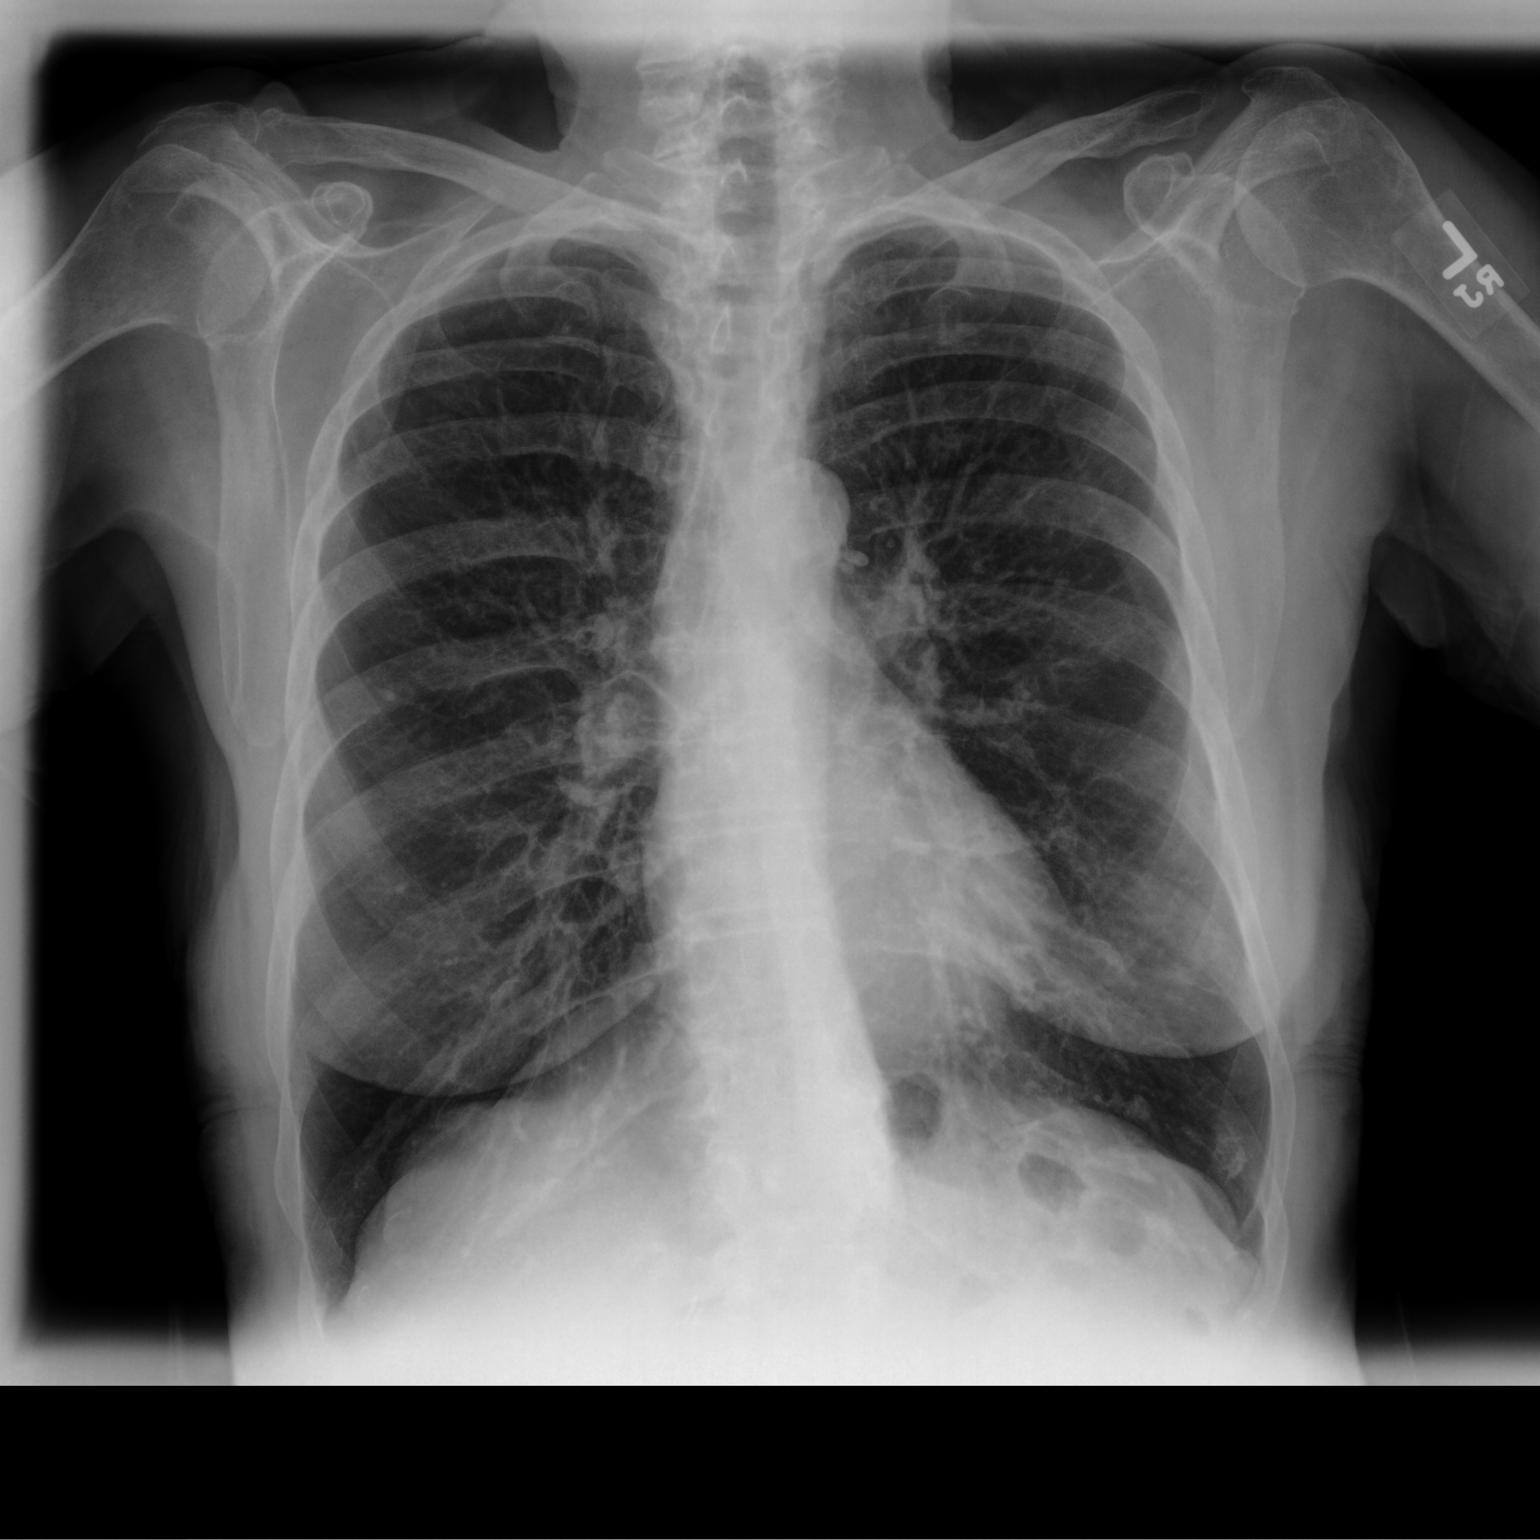

[chest lat]
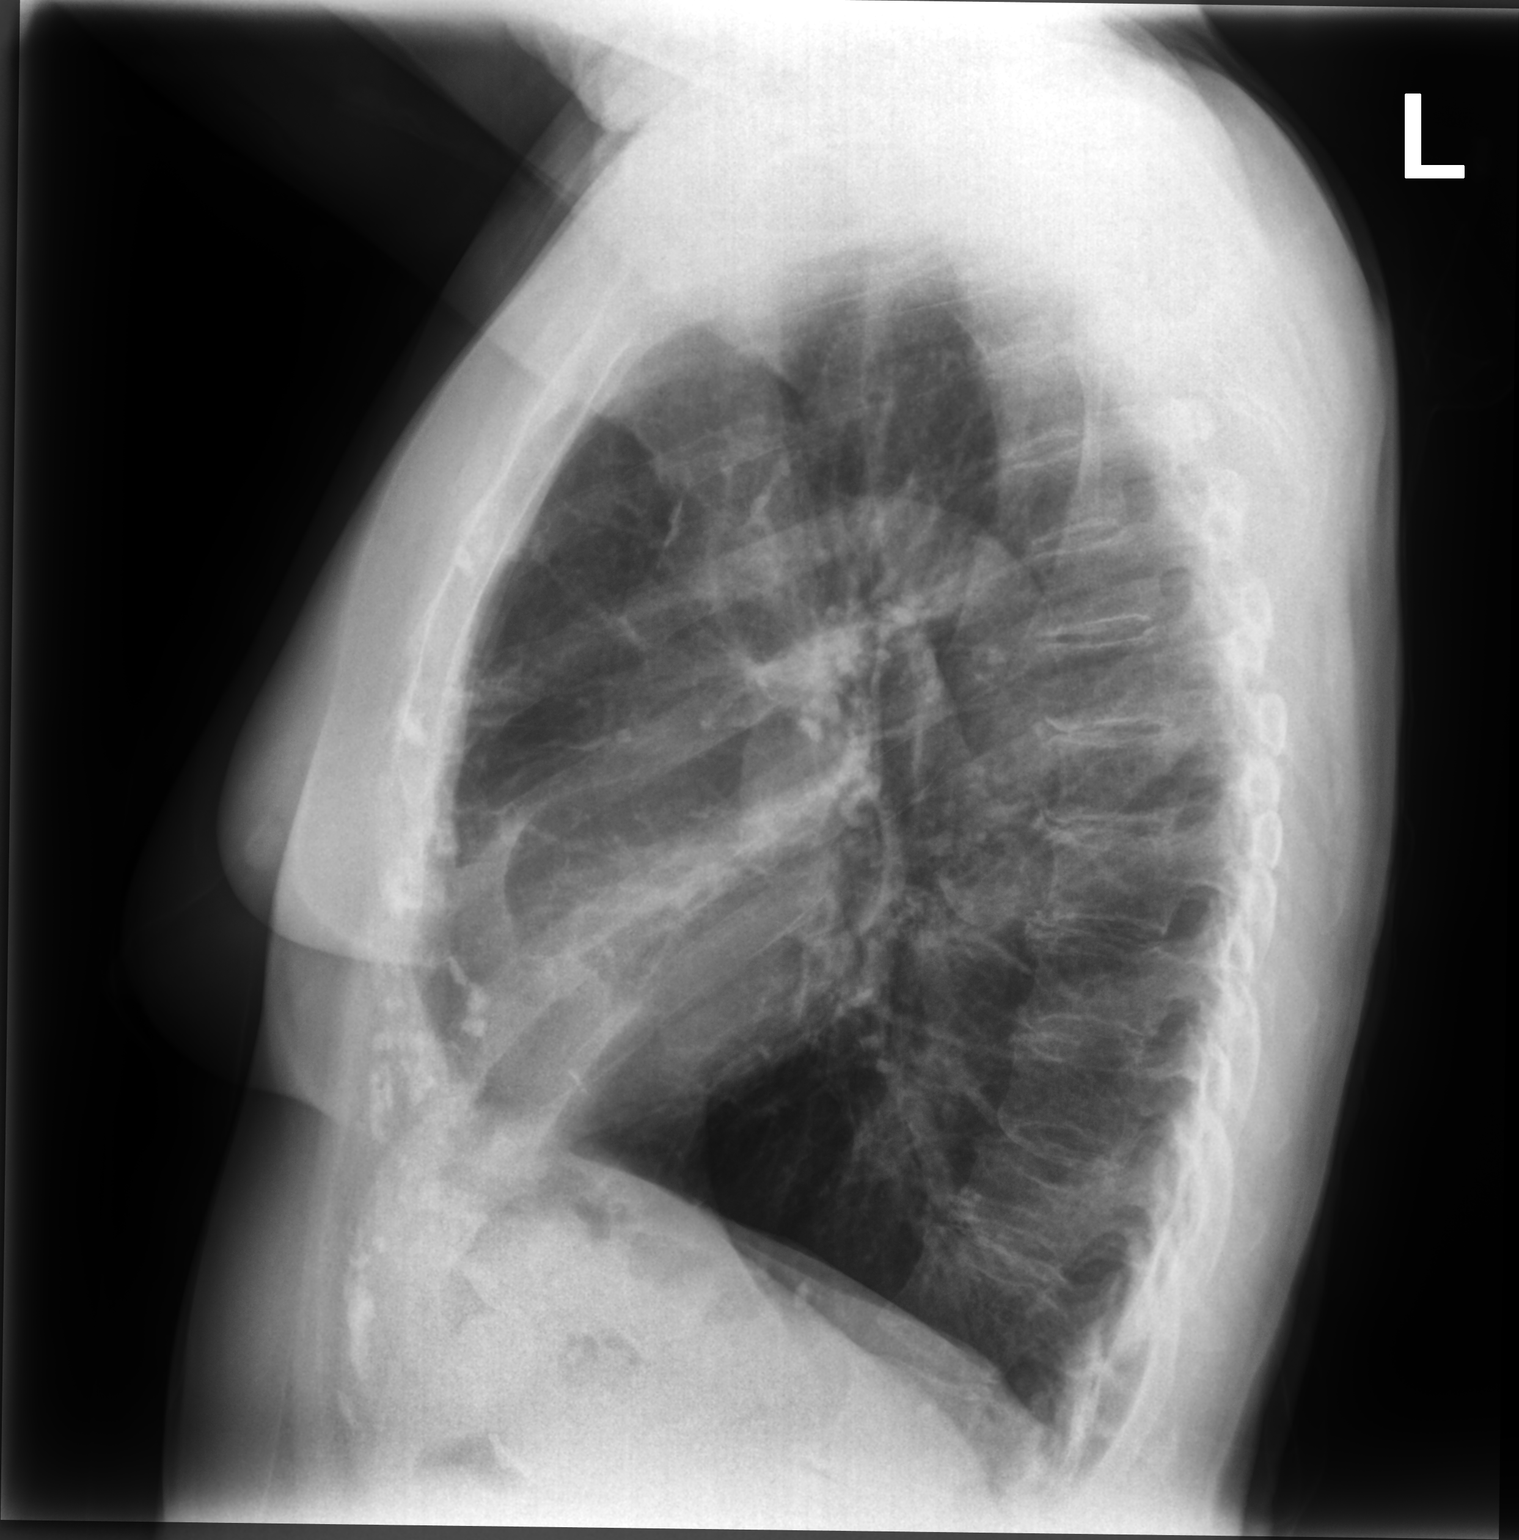

[2 of 2 positions shown; findings below may reference images not displayed]

FINDINGS: Hyperinflation of the lungs identified. No pneumothorax. The heart,
hila, mediastinum, lungs, and pleura are otherwise unremarkable and
unchanged.
IMPRESSION: Hyperinflation of the lungs consistent with COPD or emphysema. No
other interval changes.

## 2022-07-02 DIAGNOSIS — E785 Hyperlipidemia, unspecified: Secondary | ICD-10-CM | POA: Diagnosis not present

## 2022-07-02 DIAGNOSIS — G4733 Obstructive sleep apnea (adult) (pediatric): Secondary | ICD-10-CM | POA: Diagnosis not present

## 2022-07-02 DIAGNOSIS — M1990 Unspecified osteoarthritis, unspecified site: Secondary | ICD-10-CM | POA: Diagnosis not present

## 2022-07-02 DIAGNOSIS — K5900 Constipation, unspecified: Secondary | ICD-10-CM | POA: Diagnosis not present

## 2022-07-02 DIAGNOSIS — M810 Age-related osteoporosis without current pathological fracture: Secondary | ICD-10-CM | POA: Diagnosis not present

## 2022-07-02 DIAGNOSIS — R32 Unspecified urinary incontinence: Secondary | ICD-10-CM | POA: Diagnosis not present

## 2022-07-02 DIAGNOSIS — K219 Gastro-esophageal reflux disease without esophagitis: Secondary | ICD-10-CM | POA: Diagnosis not present

## 2022-07-02 DIAGNOSIS — I10 Essential (primary) hypertension: Secondary | ICD-10-CM | POA: Diagnosis not present

## 2022-07-02 DIAGNOSIS — G629 Polyneuropathy, unspecified: Secondary | ICD-10-CM | POA: Diagnosis not present

## 2022-07-11 ENCOUNTER — Other Ambulatory Visit: Payer: Self-pay | Admitting: Internal Medicine

## 2022-07-21 ENCOUNTER — Ambulatory Visit: Payer: Medicare PPO | Admitting: Internal Medicine

## 2022-07-25 ENCOUNTER — Ambulatory Visit: Payer: Medicare PPO | Admitting: Internal Medicine

## 2022-07-25 ENCOUNTER — Encounter: Payer: Self-pay | Admitting: Internal Medicine

## 2022-07-25 VITALS — BP 106/62 | HR 67 | Temp 98.4°F | Ht 62.0 in | Wt 114.4 lb

## 2022-07-25 DIAGNOSIS — J449 Chronic obstructive pulmonary disease, unspecified: Secondary | ICD-10-CM | POA: Diagnosis not present

## 2022-07-25 MED ORDER — BREZTRI AEROSPHERE 160-9-4.8 MCG/ACT IN AERO: INHALATION_SPRAY | RESPIRATORY_TRACT | 11 refills | Status: DC

## 2022-07-25 MED ORDER — MONTELUKAST SODIUM 10 MG PO TABS: 10.0000 mg | ORAL_TABLET | Freq: Every day | ORAL | 11 refills | Status: DC

## 2022-07-25 NOTE — Assessment & Plan Note (Addendum)
Quit smoking 2003/ MM  -  PFTs 01/04/08   FEV1  1.29 (58%) ratio 60 p 18% response to saba - 01/23/2015    try stiolto 2 each am - Spirometry 02/26/2015  FEV1 0.86 (41%)  ratio 51 -   05/23/2020   Walked RA  3laps @ approx 265ft each @ moderate pace  stopped due to end of study with mild  sob and sats still 97%  Allergy profile 05/23/2020 >  Eos 0.2 /  IgE  8 - 05/23/2020  After extensive coaching inhaler device,  effectiveness =    75% > restart stiolto but just one puff daily and d/c dpi spiriva as cough is one of the main problem - Labs ordered 06/19/2021  :  alpha one AT phenotype  MM level 126 - 06/19/2021   Walked on RA  x  3  lap(s) =  approx 750  ft  @ mod pace, stopped due to end of study, no sob with lowest 02 sats 93%    Group D (now reclassified as E) in terms of symptom/risk and laba/lama/ICS  therefore appropriate rx at this point >>>  breztri 2 bid and using approp saba so f/u can be yearly, sooner if needed          Each maintenance medication was reviewed in detail including emphasizing most importantly the difference between maintenance and prns and under what circumstances the prns are to be triggered using an action plan format where appropriate.  Total time for H and P, chart review, counseling, reviewing hfa device(s) and generating customized AVS unique to this office visit / same day charting = 20 min

## 2022-07-25 NOTE — Patient Instructions (Signed)
No change in your medications   Work on inhaler technique:  relax and gently blow all the way out then take a nice smooth full deep breath back in, triggering the inhaler at same time you start breathing in.  Hold breath in for at least  5 seconds if you can. Blow out breztri out  thru nose. Rinse and gargle with water when done.  If mouth or throat bother you at all,  try brushing teeth/gums/tongue with arm and hammer toothpaste/ make a slurry and gargle and spit out.   Please schedule a follow up visit in 12 months but call sooner if needed

## 2022-07-25 NOTE — Progress Notes (Signed)
Subjective:    Patient ID: Lynn Harris, female    DOB: 05/10/1945    MRN: [id number]    Brief patient profile:  89 yowf quit smoking 2003/MM with cough / breathing issues with improved breathing the cough never did resolve completely eval by Chodri and some better spiriva and symbicort then much worse x early fall 2016 felt like coming down with bad cough assoc aches and fever and cough persisted so self referred to pulmonary clinic 01/23/2015      History of Present Illness  01/23/2015 1st [facility] office visit/ Lynn Harris   Chief Complaint  Patient presents with   Pulmonary Consult    Self referral for COPD. Pt states that she was dxed with COPD in 2007. She c/o increased cough and SOB for the past 4 months. Cough is prod with clear/white to yellow/green sputum.  She states that she gets SOB with doing housework and "when I get too hot".    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  Please remember to go to the x-ray department downstairs for your tests - we will call you with the results when they are available. Please schedule a follow up office visit in 4 weeks, sooner if needed When return bring your medications in 2 separate bags, the ones you take no matter(automatically)  what vs the as needed (only when you feel  you need them)    05/23/2020  Re-establish  ov/Lynn Harris re:  GOLD III COPD  Chief Complaint  Patient presents with   Follow-up   Consult    SOB with exertion, Dry and productive cough, wheezing. Last used albuterol a week ago. 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  Rec Stop powdered spiriva Prednisone 10 mg take  4 each am x 2 days,   2 each am x 2 days,  1 each am x 2 days and stop  Plan A = Automatic = Always=    Stiolto one puff each am  Work on inhaler technique:   Plan B = Backup (to supplement plan A, not to replace it) Only use your albuterol inhaler as a rescue medication Stop fosamax  Pantoprazole (protonix) 40 mg   Take  30-60 min before first meal of the day and Pepcid (famotidine)  20 mg after supper until return to office  GERD  diet/lifestyle    06/19/2021  f/u ov/Lynn Harris re: GOLD 3 COPD  maint on breztri   Chief Complaint  Patient presents with   Follow-up    Pt states she has been doing okay since last visit and denies any real complaints.  Dyspnea:  still [employer] walking is about as much as she does  Cough: none  Sleeping: flat bed/ one pillow SABA use: once a month 02: none Rec No change in medications   Labs ordered 06/19/2021  :  alpha one AT phenotype  MM level 126   07/25/2022  f/u ov/Lynn Harris re: GOLD 3 copd  maint on Breztri 2bid   Chief Complaint  Patient presents with   Follow-up    States productive cough with white/clear sputum, sob with exertion that causes cough, heat makes it worse   Dyspnea:  walking dog 4 x daily / no hills/ [employer] still ok  Cough: only when it gets hot  Sleeping: flat bed/ one pillow  SABA use: none  02: none      No obvious day to day or daytime  variability or assoc excess/ purulent sputum or mucus plugs or hemoptysis or cp or chest tightness, subjective wheeze or overt sinus or hb symptoms.   Sleeping  without nocturnal  or early am exacerbation  of respiratory  c/o's or need for noct saba. Also denies any obvious fluctuation of symptoms with weather or environmental changes or other aggravating or alleviating factors except as outlined above   No unusual exposure hx or h/o childhood pna/ asthma or knowledge of premature birth.  Current Allergies, Complete Past Medical History, Past Surgical History, Family History, and Social History were reviewed in [employer] record.  ROS  The following are not active complaints unless bolded Hoarseness, sore throat, dysphagia, dental problems, itching, sneezing,  nasal congestion or discharge of excess mucus or purulent secretions, ear ache,   fever, chills, sweats, unintended wt loss or wt gain, classically pleuritic or exertional cp,  orthopnea pnd or arm/hand swelling  or leg swelling, presyncope, palpitations, abdominal pain, anorexia, nausea, vomiting, diarrhea  or change in bowel habits or change in bladder habits, change in stools or change in urine, dysuria, hematuria,  rash, arthralgias, visual complaints, headache, numbness, weakness or ataxia or problems with walking or coordination,  change in mood or  memory.        Current Meds  Medication Sig   albuterol (PROAIR HFA) 108 (90 Base) MCG/ACT inhaler 2 puffs every 4 hours as needed only  if your can't catch your breath   alendronate (FOSAMAX) 70 MG tablet Take 70 mg by mouth once a week.   Budeson-Glycopyrrol-Formoterol (BREZTRI AEROSPHERE) 160-9-4.8 MCG/ACT AERO INHALE 2 PUFFS FIRST THING IN AM AND THEN ANOTHER 2 PUFFS ABOUT 12 HOURS LATER   escitalopram (LEXAPRO) 10 MG tablet Take 10 mg by mouth daily.   famotidine (PEPCID) 20 MG tablet One after supper   fluticasone (FLONASE) 50 MCG/ACT nasal spray Place into the  nose.   meclizine (ANTIVERT) 25 MG tablet Take 25 mg by mouth every 8 (eight) hours.   montelukast (SINGULAIR) 10 MG tablet TAKE 1 TABLET BY MOUTH AT BEDTIME   Multiple Vitamin (MULTIVITAMIN) tablet Take 1 tablet by mouth daily. VIT B AND VIT D/PATIENTNOT SURE OF THE MG/LC   pantoprazole (PROTONIX) 40 MG tablet TAKE 1 TABLET BY MOUTH ONCE DAILY. TAKE 30 TO 60 MINUTES BEFORE FIRST MEAL OF THE DAY   Current Facility-Administered Medications for the 07/25/22 encounter (  Office Visit) with Nyoka Cowden, MD  Medication   triamcinolone acetonide (KENALOG) 10 MG/ML injection 10 mg                 Objective:   Physical Exam   Wts   06/19/2021          123  11/26/2020     125  05/23/2020       127   02/26/15 134 lb 12.8 oz (61.145 kg)  01/23/15 137 lb 6.4 oz (62.324 kg)     Vital signs reviewed  07/25/2022  - Note at rest 02 sats  96% on RA   General appearance:    amb wf nad    HEENT : Oropharynx  clear      NECK :  without  apparent JVD/ palpable Nodes/TM    LUNGS: no acc muscle use,  Min barrel  contour chest wall with bilateral  slightly decreased bs s audible wheeze and  without cough on insp or exp maneuvers and min  Hyperresonant  to  percussion bilaterally    CV:  RRR  no s3 or murmur or increase in P2, and no edema   ABD:  soft and nontender with pos end  insp Hoover's  in the supine position.  No bruits or organomegaly appreciated   MS:  Nl gait/ ext warm without deformities Or obvious joint restrictions  calf tenderness, cyanosis or clubbing     SKIN: warm and dry without lesions    NEURO:  alert, approp, nl sensorium with  no motor or cerebellar deficits apparent.                   Assessment & Plan:

## 2022-08-04 DIAGNOSIS — M5416 Radiculopathy, lumbar region: Secondary | ICD-10-CM | POA: Diagnosis not present

## 2022-08-04 DIAGNOSIS — M5136 Other intervertebral disc degeneration, lumbar region: Secondary | ICD-10-CM | POA: Diagnosis not present

## 2022-08-14 DIAGNOSIS — M5416 Radiculopathy, lumbar region: Secondary | ICD-10-CM | POA: Diagnosis not present

## 2022-12-23 DIAGNOSIS — Z1231 Encounter for screening mammogram for malignant neoplasm of breast: Secondary | ICD-10-CM | POA: Diagnosis not present

## 2023-01-09 DIAGNOSIS — R0981 Nasal congestion: Secondary | ICD-10-CM | POA: Diagnosis not present

## 2023-01-09 DIAGNOSIS — M791 Myalgia, unspecified site: Secondary | ICD-10-CM | POA: Diagnosis not present

## 2023-01-12 DIAGNOSIS — J180 Bronchopneumonia, unspecified organism: Secondary | ICD-10-CM | POA: Diagnosis not present

## 2023-01-23 DIAGNOSIS — J180 Bronchopneumonia, unspecified organism: Secondary | ICD-10-CM | POA: Diagnosis not present

## 2023-01-23 DIAGNOSIS — Z6820 Body mass index (BMI) 20.0-20.9, adult: Secondary | ICD-10-CM | POA: Diagnosis not present

## 2023-02-16 DIAGNOSIS — Z6821 Body mass index (BMI) 21.0-21.9, adult: Secondary | ICD-10-CM | POA: Diagnosis not present

## 2023-02-16 DIAGNOSIS — J180 Bronchopneumonia, unspecified organism: Secondary | ICD-10-CM | POA: Diagnosis not present

## 2023-04-03 DIAGNOSIS — H353132 Nonexudative age-related macular degeneration, bilateral, intermediate dry stage: Secondary | ICD-10-CM | POA: Diagnosis not present

## 2023-04-07 DIAGNOSIS — S098XXA Other specified injuries of head, initial encounter: Secondary | ICD-10-CM | POA: Diagnosis not present

## 2023-04-07 DIAGNOSIS — M25551 Pain in right hip: Secondary | ICD-10-CM | POA: Diagnosis not present

## 2023-04-07 DIAGNOSIS — S7001XA Contusion of right hip, initial encounter: Secondary | ICD-10-CM | POA: Diagnosis not present

## 2023-04-07 DIAGNOSIS — T148XXA Other injury of unspecified body region, initial encounter: Secondary | ICD-10-CM | POA: Diagnosis not present

## 2023-04-07 DIAGNOSIS — M25521 Pain in right elbow: Secondary | ICD-10-CM | POA: Diagnosis not present

## 2023-04-07 DIAGNOSIS — R262 Difficulty in walking, not elsewhere classified: Secondary | ICD-10-CM | POA: Diagnosis not present

## 2023-04-07 DIAGNOSIS — R220 Localized swelling, mass and lump, head: Secondary | ICD-10-CM | POA: Diagnosis not present

## 2023-04-07 DIAGNOSIS — S50311A Abrasion of right elbow, initial encounter: Secondary | ICD-10-CM | POA: Diagnosis not present

## 2023-04-07 DIAGNOSIS — M79651 Pain in right thigh: Secondary | ICD-10-CM | POA: Diagnosis not present

## 2023-04-07 DIAGNOSIS — W19XXXA Unspecified fall, initial encounter: Secondary | ICD-10-CM | POA: Diagnosis not present

## 2023-04-21 DIAGNOSIS — M1611 Unilateral primary osteoarthritis, right hip: Secondary | ICD-10-CM | POA: Diagnosis not present

## 2023-06-23 DIAGNOSIS — Z0000 Encounter for general adult medical examination without abnormal findings: Secondary | ICD-10-CM | POA: Diagnosis not present

## 2023-06-23 DIAGNOSIS — R059 Cough, unspecified: Secondary | ICD-10-CM | POA: Diagnosis not present

## 2023-06-23 DIAGNOSIS — R7303 Prediabetes: Secondary | ICD-10-CM | POA: Diagnosis not present

## 2023-06-23 DIAGNOSIS — D519 Vitamin B12 deficiency anemia, unspecified: Secondary | ICD-10-CM | POA: Diagnosis not present

## 2023-06-23 DIAGNOSIS — Z1331 Encounter for screening for depression: Secondary | ICD-10-CM | POA: Diagnosis not present

## 2023-06-23 DIAGNOSIS — E559 Vitamin D deficiency, unspecified: Secondary | ICD-10-CM | POA: Diagnosis not present

## 2023-06-23 DIAGNOSIS — Z6821 Body mass index (BMI) 21.0-21.9, adult: Secondary | ICD-10-CM | POA: Diagnosis not present

## 2023-06-23 DIAGNOSIS — R5383 Other fatigue: Secondary | ICD-10-CM | POA: Diagnosis not present

## 2023-06-23 DIAGNOSIS — D649 Anemia, unspecified: Secondary | ICD-10-CM | POA: Diagnosis not present

## 2023-06-23 DIAGNOSIS — Z1339 Encounter for screening examination for other mental health and behavioral disorders: Secondary | ICD-10-CM | POA: Diagnosis not present

## 2023-07-11 ENCOUNTER — Encounter (HOSPITAL_COMMUNITY): Payer: Self-pay | Admitting: Interventional Radiology

## 2023-07-29 ENCOUNTER — Other Ambulatory Visit: Payer: Self-pay | Admitting: Internal Medicine

## 2023-08-07 DIAGNOSIS — L089 Local infection of the skin and subcutaneous tissue, unspecified: Secondary | ICD-10-CM | POA: Diagnosis not present

## 2023-08-07 DIAGNOSIS — Z6821 Body mass index (BMI) 21.0-21.9, adult: Secondary | ICD-10-CM | POA: Diagnosis not present

## 2023-09-17 DIAGNOSIS — B029 Zoster without complications: Secondary | ICD-10-CM | POA: Diagnosis not present

## 2023-09-17 DIAGNOSIS — L03211 Cellulitis of face: Secondary | ICD-10-CM | POA: Diagnosis not present

## 2023-09-17 DIAGNOSIS — Z6821 Body mass index (BMI) 21.0-21.9, adult: Secondary | ICD-10-CM | POA: Diagnosis not present

## 2023-10-23 DIAGNOSIS — R509 Fever, unspecified: Secondary | ICD-10-CM | POA: Diagnosis not present

## 2023-10-23 DIAGNOSIS — U071 COVID-19: Secondary | ICD-10-CM | POA: Diagnosis not present

## 2023-11-07 ENCOUNTER — Other Ambulatory Visit: Payer: Self-pay | Admitting: Internal Medicine

## 2023-11-10 NOTE — Telephone Encounter (Unsigned)
 Copied from CRM [id number]. Topic: Clinical - Medication Refill >> Nov 10, 2023  2:31 PM Leila BROCKS wrote: Patient [id number] states pharmacy will not refill budesonide-glycopyrrolate-formoterol (BREZTRI  AEROSPHERE) 160-9-4.8 MCG/ACT AERO inhaler until patient is seen by Dr. Darlean ronco 07/25/22. Patient has an upcoming appointment 01/05/24, is out of medication and if patient can have a refill until office visit. Please advise.   [facility] [address], [place] - [address] [place] [address] Eynwz:[phone]:[phone]

## 2023-11-19 DIAGNOSIS — J180 Bronchopneumonia, unspecified organism: Secondary | ICD-10-CM | POA: Diagnosis not present

## 2023-12-28 ENCOUNTER — Other Ambulatory Visit: Payer: Self-pay | Admitting: Internal Medicine

## 2024-01-05 ENCOUNTER — Encounter: Payer: Self-pay | Admitting: Internal Medicine

## 2024-01-05 ENCOUNTER — Ambulatory Visit: Admitting: Internal Medicine

## 2024-01-05 VITALS — BP 108/66 | HR 78 | Ht 62.0 in | Wt 113.8 lb

## 2024-01-05 DIAGNOSIS — Z87891 Personal history of nicotine dependence: Secondary | ICD-10-CM | POA: Diagnosis not present

## 2024-01-05 DIAGNOSIS — J449 Chronic obstructive pulmonary disease, unspecified: Secondary | ICD-10-CM

## 2024-01-05 MED ORDER — BREZTRI AEROSPHERE 160-9-4.8 MCG/ACT IN AERO: 2.0000 | INHALATION_SPRAY | Freq: Two times a day (BID) | RESPIRATORY_TRACT | Status: DC

## 2024-01-05 NOTE — Progress Notes (Signed)
 "   Subjective:    Patient ID: Lynn Harris, female    DOB: May 25, 1945    MRN: [id number]    Brief patient profile:  78 yowf quit smoking 2003/MM with cough / breathing issues with improved breathing the cough never did resolve completely eval by Chodri and some better spiriva and symbicort then much worse x early fall 2016 felt like coming down with bad cough assoc aches and fever and cough persisted so self referred to pulmonary clinic 01/23/2015      History of Present Illness  01/23/2015 1st [facility] office visit/ Lynn Harris   Chief Complaint  Patient presents with   Pulmonary Consult    Self referral for COPD. Pt states that she was dxed with COPD in 2007. She c/o increased cough and SOB for the past 4 months. Cough is prod with clear/white to yellow/green sputum.  She states that she gets SOB with doing housework and when I get too hot.    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  Please remember to go to the x-ray department downstairs for your tests - we will call you with the results when they are available. Please schedule a follow up office visit in 4 weeks, sooner if needed When return bring your medications in 2 separate bags, the ones you take no matter(automatically)  what vs the as needed (only when you feel  you need them)    05/23/2020  Re-establish  ov/Lynn Harris re:  GOLD III COPD  Chief Complaint  Patient presents with   Follow-up   Consult    SOB with exertion, Dry and productive cough, wheezing. Last used albuterol  a week ago. 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  Rec Stop powdered spiriva Prednisone  10 mg take  4 each am x 2 days,   2 each am x 2 days,  1 each am x 2 days and stop  Plan A = Automatic = Always=    Stiolto one puff each am  Work on inhaler technique:   Plan B = Backup (to supplement plan A, not to replace it) Only use your albuterol  inhaler as a rescue medication Stop fosamax  Pantoprazole  (protonix ) 40 mg   Take  30-60 min before first meal of the day and Pepcid  (famotidine )  20 mg after supper until return to office  GERD diet/lifestyle    Labs ordered 06/19/2021  :  alpha one AT phenotype  MM level 126   07/25/2022  f/u ov/Lynn Harris re: GOLD 3 copd  maint on Breztri  2bid   Chief Complaint  Patient presents with   Follow-up    States productive cough with white/clear sputum, sob with exertion that causes cough, heat makes it worse   Dyspnea:  walking dog 4 x daily / no hills/ [employer] still ok  Cough: only when it gets hot  Sleeping: flat bed/ one pillow  SABA use: none  02: none  Rec Work on inhaler technique:    01/05/2024  f/u ov/Lynn Harris re:  GOLD 3 copd   maint on breztri  /singular  Chief Complaint  Patient presents with   Medical Management of Chronic Issues   COPD    Breathing has been slightly worse since the last visit. She has occ cough with clear sputum.    Dyspnea:  house work a merchandiser, retail walking ok / [employer] shopping ok/ struggling to take care of her husband post  cva Cough: min mucoid  Sleeping: flat bed one pillow s resp cc  SABA use: rarely uses/ hfa suboptimal   02: none    No obvious day to day or daytime variability or assoc excess/ purulent sputum or mucus plugs or hemoptysis or cp or chest tightness, subjective wheeze or overt sinus or hb symptoms.    Also denies any obvious fluctuation of symptoms with weather or environmental changes or other aggravating or alleviating factors except as outlined above   No unusual exposure hx or h/o childhood pna/ asthma or knowledge of premature birth.  Current Allergies, Complete Past Medical History, Past Surgical History, Family History, and Social History were reviewed in [employer] record.  ROS  The following are not active complaints unless bolded Hoarseness, sore throat, dysphagia, dental problems, itching, sneezing,  nasal congestion or discharge of excess mucus or purulent secretions, ear ache,   fever, chills, sweats, unintended wt loss or wt gain, classically pleuritic or exertional cp,  orthopnea pnd or arm/hand swelling  or leg swelling, presyncope, palpitations, abdominal pain, anorexia, nausea, vomiting, diarrhea  or change in bowel habits or change in bladder habits, change in stools or change in urine, dysuria, hematuria,  rash, arthralgias, visual complaints, headache, numbness, weakness or ataxia or problems with walking or coordination,  change in mood or  memory.          Outpatient Medications Prior to Visit  Medication Sig Dispense Refill   albuterol  (PROAIR  HFA) 108 (90 Base) MCG/ACT inhaler 2 puffs every 4 hours as needed only  if your can't catch your breath 1 each 11   budesonide-glycopyrrolate-formoterol (BREZTRI  AEROSPHERE) 160-9-4.8 MCG/ACT AERO inhaler INHALE 2 PUFFS IN THE MORNING AND 2 IN THE EVENING 12  HOURS  LATER  -  NEEDS  APPOINTMENT 11 g 0   escitalopram (LEXAPRO) 10 MG tablet Take 10 mg by mouth daily.     famotidine  (PEPCID ) 20 MG tablet One  after supper 30 tablet 11   fluticasone (FLONASE) 50 MCG/ACT nasal spray Place into the nose.     meclizine (ANTIVERT) 25 MG tablet Take 25 mg by mouth every 8 (eight) hours.     Multiple Vitamin (MULTIVITAMIN) tablet Take 1 tablet by mouth daily. VIT B AND VIT D/PATIENTNOT SURE OF THE MG/LC     pantoprazole  (PROTONIX ) 40 MG tablet TAKE 1 TABLET BY MOUTH ONCE DAILY. TAKE 30 TO 60 MINUTES BEFORE  FIRST MEAL OF THE DAY 90 tablet 0   alendronate (FOSAMAX) 70 MG tablet Take 70 mg by mouth once a week. (Patient not taking: Reported on 01/05/2024)     montelukast  (SINGULAIR ) 10 MG tablet TAKE 1 TABLET BY MOUTH AT BEDTIME (Patient not taking: Reported on 01/05/2024) 30 tablet 1   Facility-Administered Medications Prior to Visit  Medication Dose Route Frequency Provider Last Rate Last Admin   triamcinolone  acetonide (KENALOG ) 10 MG/ML injection 10 mg  10 mg Other Once Joya Ade, DPM                  Objective:   Physical Exam   Wts  01/05/2024     113  06/19/2021         123  11/26/2020     125  05/23/2020       127   02/26/15 134 lb 12.8 oz (61.145 kg)  01/23/15 137 lb 6.4 oz (62.324 kg)    Vital signs reviewed  01/05/2024  - Note at rest 02 sats  95% on RA   General appearance:    amb wf nad    HEENT : mask in place    NECK :  without  apparent JVD/ palpable Nodes/TM    LUNGS: no acc muscle use,  Min barrel  contour chest wall with bilateral  distant  wheeze better with PLM and  without cough on insp or exp maneuvers and min  Hyperresonant  to  percussion bilaterally    CV:  RRR  no s3 or murmur or increase in P2, and no edema   ABD:  soft and nontender    MS:  Nl gait/ ext warm without deformities Or obvious joint restrictions  calf tenderness, cyanosis or clubbing     SKIN: warm and dry without lesions    NEURO:  alert, approp, nl sensorium with  no motor or cerebellar deficits apparent.          Assessment & Plan:   Assessment & Plan COPD GOLD III Quit smoking  2003/ MM  -  PFTs 01/04/08   FEV1  1.29 (58%) ratio 60 p 18% response to saba - 01/23/2015    try stiolto 2 each am - Spirometry 02/26/2015  FEV1 0.86 (41%)  ratio 51 -   05/23/2020   Walked RA  3laps @ approx 240ft each @ moderate pace  stopped due to end of study with mild  sob and sats still 97%  Allergy profile 05/23/2020 >  Eos 0.2 /  IgE  8 - 05/23/2020  After extensive coaching inhaler device,  effectiveness =    75% > restart stiolto but just one puff daily and d/c dpi spiriva as cough is one of the main problem - Labs ordered 06/19/2021  :  alpha one AT phenotype  MM level 126 - 06/19/2021   Walked on RA  x  3  lap(s) =  approx 750  ft  @ mod pace, stopped due to end of study, no sob with lowest 02 sats 93%  - 01/05/2024  After extensive coaching inhaler device,  effectiveness =    75% from a baseline of < 50% \01/05/2024   - Walked on RA  x  3  lap(s) =  approx 750  ft  @ fast pace, stopped due to end of study  with lowest 02 sats 95% but back to 96% before stopping and no sob    Group E in terms of symptoms/risk so  laba/lama/ICS  therefore appropriate rx at this point >>>  breztri    and approp SABA prn.  Re SABA :  I spent extra time with pt today reviewing appropriate use of albuterol  for prn use on exertion with the following points: 1) saba is for relief of sob that does not improve by walking a slower pace or resting but rather if the pt does not improve after trying this first. 2) If the pt is convinced, as many are, that saba helps recover from activity faster then it's easy to tell if this is the case by re-challenging : ie stop, take the inhaler, then p 5 minutes try the exact same activity (intensity of workload) that just caused the symptoms and see if they are substantially diminished or not after saba 3) if there is an activity that reproducibly causes the symptoms, try the saba 15 min before the activity on alternate days   If in fact the saba really does help, then fine to continue  to use it prn but advised may need to look closer at the maintenance regimen being used to achieve better control of airways disease with exertion.    >>> strongly consider pulmonary rehab in [place] as not practical to come here   >>> f/u ov 6 m sooner prn   Each maintenance medication was reviewed in detail including emphasizing most importantly the difference between maintenance and prns and under what circumstances the prns are to be triggered using an action plan format where appropriate.  Total time for H and P, chart review, counseling, reviewing hfa device(s) , directly observing portions of ambulatory 02 saturation study/ and generating customized AVS unique to this office visit / same day charting = 32 min                    AVS  Patient Instructions  Work on inhaler technique:  relax and gently blow all the way out then take a nice smooth full deep breath back in, triggering the inhaler at same time you start breathing in.  Hold breath in for at least  5 seconds if you can. Blow out Breztri   thru nose. Rinse and gargle with water when done.  If mouth or throat bother you at all,  try brushing teeth/gums/tongue with arm and hammer toothpaste/ make a slurry and gargle and spit out.   >>>  Remember how golfers warm up by taking practice swings - do this with an empty inhaler    Also  Ok to try albuterol  15 min before an activity (on alternating days)  that you know would usually make you short of breath and see if it makes any difference and if makes none then don't take albuterol  after activity unless you can't catch your breath as this means it's the resting that helps, not the albuterol .  Please schedule a follow up visit in 6 months but call sooner if needed             Ozell America, MD 01/05/2024  "

## 2024-01-05 NOTE — Assessment & Plan Note (Addendum)
 Quit smoking 2003/ MM  -  PFTs 01/04/08   FEV1  1.29 (58%) ratio 60 p 18% response to saba - 01/23/2015    try stiolto 2 each am - Spirometry 02/26/2015  FEV1 0.86 (41%)  ratio 51 -   05/23/2020   Walked RA  3laps @ approx 241ft each @ moderate pace  stopped due to end of study with mild  sob and sats still 97%  Allergy profile 05/23/2020 >  Eos 0.2 /  IgE  8 - 05/23/2020  After extensive coaching inhaler device,  effectiveness =    75% > restart stiolto but just one puff daily and d/c dpi spiriva as cough is one of the main problem - Labs ordered 06/19/2021  :  alpha one AT phenotype  MM level 126 - 06/19/2021   Walked on RA  x  3  lap(s) =  approx 750  ft  @ mod pace, stopped due to end of study, no sob with lowest 02 sats 93%  - 01/05/2024  After extensive coaching inhaler device,  effectiveness =    75% from a baseline of < 50% \01/05/2024   - Walked on RA  x  3  lap(s) =  approx 750  ft  @ fast pace, stopped due to end of study  with lowest 02 sats 95% but back to 96% before stopping and no sob    Group E in terms of symptoms/risk so  laba/lama/ICS  therefore appropriate rx at this point >>>  breztri    and approp SABA prn.  Re SABA :  I spent extra time with pt today reviewing appropriate use of albuterol  for prn use on exertion with the following points: 1) saba is for relief of sob that does not improve by walking a slower pace or resting but rather if the pt does not improve after trying this first. 2) If the pt is convinced, as many are, that saba helps recover from activity faster then it's easy to tell if this is the case by re-challenging : ie stop, take the inhaler, then p 5 minutes try the exact same activity (intensity of workload) that just caused the symptoms and see if they are substantially diminished or not after saba 3) if there is an activity that reproducibly causes the symptoms, try the saba 15 min before the activity on alternate days   If in fact the saba really does help, then  fine to continue to use it prn but advised may need to look closer at the maintenance regimen being used to achieve better control of airways disease with exertion.    >>> strongly consider pulmonary rehab in [place] as not practical to come here   >>> f/u ov 6 m sooner prn   Each maintenance medication was reviewed in detail including emphasizing most importantly the difference between maintenance and prns and under what circumstances the prns are to be triggered using an action plan format where appropriate.  Total time for H and P, chart review, counseling, reviewing hfa device(s) , directly observing portions of ambulatory 02 saturation study/ and generating customized AVS unique to this office visit / same day charting = 32 min

## 2024-01-05 NOTE — Patient Instructions (Addendum)
 Work on inhaler technique:  relax and gently blow all the way out then take a nice smooth full deep breath back in, triggering the inhaler at same time you start breathing in.  Hold breath in for at least  5 seconds if you can. Blow out Breztri   thru nose. Rinse and gargle with water when done.  If mouth or throat bother you at all,  try brushing teeth/gums/tongue with arm and hammer toothpaste/ make a slurry and gargle and spit out.   >>>  Remember how golfers warm up by taking practice swings - do this with an empty inhaler    Also  Ok to try albuterol  15 min before an activity (on alternating days)  that you know would usually make you short of breath and see if it makes any difference and if makes none then don't take albuterol  after activity unless you can't catch your breath as this means it's the resting that helps, not the albuterol .  Please schedule a follow up visit in 6 months but call sooner if needed

## 2024-02-04 ENCOUNTER — Other Ambulatory Visit: Payer: Self-pay | Admitting: Internal Medicine

## 2024-02-05 ENCOUNTER — Telehealth: Payer: Self-pay

## 2024-02-05 MED ORDER — BREZTRI AEROSPHERE 160-9-4.8 MCG/ACT IN AERO: 2.0000 | INHALATION_SPRAY | Freq: Two times a day (BID) | RESPIRATORY_TRACT | 2 refills | Status: AC

## 2024-02-05 MED ORDER — MONTELUKAST SODIUM 10 MG PO TABS: 10.0000 mg | ORAL_TABLET | Freq: Every day | ORAL | 0 refills | Status: AC

## 2024-02-05 NOTE — Telephone Encounter (Signed)
 Copied from CRM [id number]. Topic: Clinical - Medication Question >> Feb 05, 2024 11:49 AM Dedra B wrote: Reason for CRM: Patient called to follow up on refill request for budesonide-glycopyrrolate-formoterol (BREZTRI  AEROSPHERE) 160-9-4.8 MCG/ACT AERO inhaler. She said she's almost out.  ATC X1. LMTCB. Rx refilled

## 2024-02-05 NOTE — Telephone Encounter (Signed)
 Copied from CRM [id number]. Topic: Clinical - Medication Refill >> Feb 05, 2024 11:52 AM Dedra B wrote: Medication:  montelukast  (SINGULAIR ) 10 MG tablet  Has the patient contacted their pharmacy? No refills  This is the patient's preferred pharmacy:  [facility] [address] [place] - [address] [place] [address] Phone: [phone] Fax: [phone]  Is this the correct pharmacy for this prescription? Yes  Has the prescription been filled recently? No  Is the patient out of the medication? Yes  Has the patient been seen for an appointment in the last year OR does the patient have an upcoming appointment? Yes  Can we respond through MyChart? No  Agent: Please be advised that Rx refills may take up to 3 business days. We ask that you follow-up with your pharmacy.   ATC X1. LMTCB. REFILL SENT

## 2024-06-15 ENCOUNTER — Ambulatory Visit: Admitting: Internal Medicine
